# Patient Record
Sex: Male | Born: 1995
Health system: Southern US, Community
[De-identification: ages and names within clinical notes are randomized; demographics above are authoritative.]

## PROBLEM LIST (undated history)

## (undated) DIAGNOSIS — Z21 Asymptomatic human immunodeficiency virus [HIV] infection status: Secondary | ICD-10-CM

## (undated) DIAGNOSIS — J111 Influenza due to unidentified influenza virus with other respiratory manifestations: Secondary | ICD-10-CM

## (undated) DIAGNOSIS — B20 Human immunodeficiency virus [HIV] disease: Secondary | ICD-10-CM

## (undated) HISTORY — DX: Influenza due to unidentified influenza virus with other respiratory manifestations: J11.1

## (undated) HISTORY — DX: Asymptomatic human immunodeficiency virus (hiv) infection status: Z21

## (undated) HISTORY — DX: Human immunodeficiency virus (HIV) disease: B20

## (undated) HISTORY — PX: NO PAST SURGERIES: SHX2092

---
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
## 2015-11-20 ENCOUNTER — Ambulatory Visit (INDEPENDENT_AMBULATORY_CARE_PROVIDER_SITE_OTHER): Payer: BLUE CROSS/BLUE SHIELD | Admitting: Family

## 2015-11-20 ENCOUNTER — Encounter: Payer: Self-pay | Admitting: Family

## 2015-11-20 VITALS — BP 124/70 | HR 85 | Temp 97.7°F | Ht 68.75 in | Wt 152.0 lb

## 2015-11-20 DIAGNOSIS — A084 Viral intestinal infection, unspecified: Secondary | ICD-10-CM | POA: Diagnosis not present

## 2015-11-20 MED ORDER — ONDANSETRON 4 MG PO TBDP: 4.0000 mg | ORAL_TABLET | Freq: Three times a day (TID) | ORAL | Status: DC | PRN

## 2015-11-20 NOTE — Patient Instructions (Signed)

## 2015-11-20 NOTE — Progress Notes (Signed)
   Subjective:    Patient ID: Shawn Cooke, male    DOB: 1996-06-10, 20 y.o.   MRN: [id number]  Emesis  This is a new problem. The current episode started today. The problem occurs 5 to 10 times per day. The problem has been waxing and waning. The emesis has an appearance of stomach contents and bile. There has been no fever. Associated symptoms include abdominal pain and chills. Pertinent negatives include no dizziness, fever, myalgias or URI. He has tried bed rest for the symptoms. The treatment provided mild relief.      Review of Systems  Constitutional: Positive for chills. Negative for fever.  HENT: Negative.   Respiratory: Negative.   Cardiovascular: Negative.   Gastrointestinal: Positive for vomiting and abdominal pain.  Endocrine: Negative.   Genitourinary: Negative.   Musculoskeletal: Negative.  Negative for myalgias.  Neurological: Negative.  Negative for dizziness.  Hematological: Negative.   Psychiatric/Behavioral: Negative.   All other systems reviewed and are negative.      Objective:   Physical Exam  Constitutional: He is oriented to person, place, and time. He appears well-developed and well-nourished. No distress.  HENT:  Head: Normocephalic.  Right Ear: External ear normal.  Left Ear: External ear normal.  Nose: Nose normal.  Mouth/Throat: Oropharynx is clear and moist.  Eyes: Pupils are equal, round, and reactive to light. Right eye exhibits no discharge. Left eye exhibits no discharge.  Neck: Normal range of motion. Neck supple. No thyromegaly present.  Cardiovascular: Normal rate, regular rhythm, normal heart sounds and intact distal pulses.   No murmur heard. Pulmonary/Chest: Effort normal and breath sounds normal. No respiratory distress. He has no wheezes.  Abdominal: Soft. He exhibits no distension. There is no tenderness.  Hyperactive BS   Musculoskeletal: Normal range of motion. He exhibits no edema or tenderness.  Neurological: He is alert  and oriented to person, place, and time. He has normal reflexes. No cranial nerve deficit.  Skin: Skin is warm and dry. No rash noted. No erythema.  Psychiatric: He has a normal mood and affect. His behavior is normal. Judgment and thought content normal.  Vitals reviewed.     BP 124/70 mmHg  Pulse 85  Temp(Src) 97.7 F (36.5 C) (Oral)  Ht 5' 8.75" (1.746 m)  Wt 152 lb (68.947 kg)  BMI 22.62 kg/m2     Assessment & Plan:  1. Viral gastroenteritis -Force fluids -Bland diet -Tylenol prn for fever  -RTO prn  - ondansetron (ZOFRAN ODT) 4 MG disintegrating tablet; Take 1 tablet (4 mg total) by mouth every 8 (eight) hours as needed for nausea or vomiting.  Dispense: 20 tablet; Refill: 0  Jannifer Rodney, FNP

## 2015-12-11 ENCOUNTER — Ambulatory Visit (INDEPENDENT_AMBULATORY_CARE_PROVIDER_SITE_OTHER): Payer: BLUE CROSS/BLUE SHIELD | Admitting: Family Medicine

## 2015-12-11 ENCOUNTER — Ambulatory Visit (INDEPENDENT_AMBULATORY_CARE_PROVIDER_SITE_OTHER): Payer: BLUE CROSS/BLUE SHIELD

## 2015-12-11 ENCOUNTER — Encounter: Payer: Self-pay | Admitting: Family Medicine

## 2015-12-11 VITALS — BP 115/72 | HR 83 | Temp 98.1°F | Ht 68.0 in | Wt 159.2 lb

## 2015-12-11 DIAGNOSIS — M25572 Pain in left ankle and joints of left foot: Secondary | ICD-10-CM | POA: Diagnosis not present

## 2015-12-11 DIAGNOSIS — S93402A Sprain of unspecified ligament of left ankle, initial encounter: Secondary | ICD-10-CM | POA: Diagnosis not present

## 2015-12-11 MED ORDER — DICLOFENAC SODIUM 75 MG PO TBEC: 75.0000 mg | DELAYED_RELEASE_TABLET | Freq: Two times a day (BID) | ORAL | Status: DC

## 2015-12-11 NOTE — Patient Instructions (Signed)
Ankle Sprain  An ankle sprain is an injury to the strong, fibrous tissues (ligaments) that hold the bones of your ankle joint together.   CAUSES  An ankle sprain is usually caused by a fall or by twisting your ankle. Ankle sprains most commonly occur when you step on the outer edge of your foot, and your ankle turns inward. People who participate in sports are more prone to these types of injuries.   SYMPTOMS    Pain in your ankle. The pain may be present at rest or only when you are trying to stand or walk.   Swelling.   Bruising. Bruising may develop immediately or within 1 to 2 days after your injury.   Difficulty standing or walking, particularly when turning corners or changing directions.  DIAGNOSIS   Your caregiver will ask you details about your injury and perform a physical exam of your ankle to determine if you have an ankle sprain. During the physical exam, your caregiver will press on and apply pressure to specific areas of your foot and ankle. Your caregiver will try to move your ankle in certain ways. An X-ray exam may be done to be sure a bone was not broken or a ligament did not separate from one of the bones in your ankle (avulsion fracture).   TREATMENT   Certain types of braces can help stabilize your ankle. Your caregiver can make a recommendation for this. Your caregiver may recommend the use of medicine for pain. If your sprain is severe, your caregiver may refer you to a surgeon who helps to restore function to parts of your skeletal system (orthopedist) or a physical therapist.  HOME CARE INSTRUCTIONS    Apply ice to your injury for 1-2 days or as directed by your caregiver. Applying ice helps to reduce inflammation and pain.    Put ice in a plastic bag.    Place a towel between your skin and the bag.    Leave the ice on for 15-20 minutes at a time, every 2 hours while you are awake.   Only take over-the-counter or prescription medicines for pain, discomfort, or fever as directed by  your caregiver.   Elevate your injured ankle above the level of your heart as much as possible for 2-3 days.   If your caregiver recommends crutches, use them as instructed. Gradually put weight on the affected ankle. Continue to use crutches or a cane until you can walk without feeling pain in your ankle.   If you have a plaster splint, wear the splint as directed by your caregiver. Do not rest it on anything harder than a pillow for the first 24 hours. Do not put weight on it. Do not get it wet. You may take it off to take a shower or bath.   You may have been given an elastic bandage to wear around your ankle to provide support. If the elastic bandage is too tight (you have numbness or tingling in your foot or your foot becomes cold and blue), adjust the bandage to make it comfortable.   If you have an air splint, you may blow more air into it or let air out to make it more comfortable. You may take your splint off at night and before taking a shower or bath. Wiggle your toes in the splint several times per day to decrease swelling.  SEEK MEDICAL CARE IF:    You have rapidly increasing bruising or swelling.   Your toes feel   extremely cold or you lose feeling in your foot.   Your pain is not relieved with medicine.  SEEK IMMEDIATE MEDICAL CARE IF:   Your toes are numb or blue.   You have severe pain that is increasing.  MAKE SURE YOU:    Understand these instructions.   Will watch your condition.   Will get help right away if you are not doing well or get worse.     This information is not intended to replace advice given to you by your health care provider. Make sure you discuss any questions you have with your health care provider.     Document Released: 10/19/2005 Document Revised: 11/09/2014 Document Reviewed: 10/31/2011  Elsevier Interactive Patient Education 2016 Elsevier Inc[employer]

## 2015-12-11 NOTE — Progress Notes (Signed)
   Subjective:  Patient ID: Shawn Cooke, male    DOB: 02-Jul-1996  Age: 20 y.o. MRN: [id number]  CC: Ankle Pain   HPI Shawn Cooke presents for  Stepped wrong going for a layup playing basketball last night. Cmae down on side of foot. After game started having increasing pain in the left ankle. Awoke him during the night . 7/10 with swelling  History Shawn Cooke has no past medical history on file.   He has no past surgical history on file.   His family history is not on file.He reports that he has never smoked. He does not have any smokeless tobacco history on file. He reports that he does not drink alcohol or use illicit drugs.  No current outpatient prescriptions on file prior to visit.   No current facility-administered medications on file prior to visit.    ROS Review of Systems  Constitutional: Negative for fever, chills and diaphoresis.  HENT: Negative for rhinorrhea and sore throat.   Respiratory: Negative for cough and shortness of breath.   Cardiovascular: Negative for chest pain.  Gastrointestinal: Negative for abdominal pain.  Musculoskeletal: Positive for joint swelling and arthralgias. Negative for myalgias.  Skin: Negative for rash.  Neurological: Negative for weakness and headaches.    Objective:  BP 115/72 mmHg  Pulse 83  Temp(Src) 98.1 F (36.7 C) (Oral)  Ht  (1.727 m)  Wt 159 lb 3.2 oz (72.213 kg)  BMI 24.21 kg/m2  SpO2 98%  Physical Exam  Constitutional: He appears well-developed and well-nourished.  HENT:  Head: Normocephalic and atraumatic.  Right Ear: External ear normal.  Left Ear: External ear normal.  Mouth/Throat: No oropharyngeal exudate or posterior oropharyngeal erythema.  Eyes: Pupils are equal, round, and reactive to light.  Neck: Normal range of motion. Neck supple.  Cardiovascular: Normal rate and regular rhythm.   No murmur heard. Pulmonary/Chest: Breath sounds normal. No respiratory distress.  Abdominal: Bowel sounds  are normal.  Musculoskeletal: He exhibits edema (mild edema at left lateral malleolus and just superiorly.) and tenderness (for palpation lateral malleolua and all ROM L ankle. ).  Ligaments intact for opening at varus/valgus maneuvers and Drawer signs fo left ankle. NV intact BLE  Vitals reviewed.   Assessment & Plan:   REMIGIO was seen today for ankle pain.  Diagnoses and all orders for this visit:  Left ankle pain -     DG Ankle Complete Left  Sprain of left ankle, initial encounter -     DME Other see comment  Other orders -     diclofenac (VOLTAREN) 75 MG EC tablet; Take 1 tablet (75 mg total) by mouth 2 (two) times daily.   I have discontinued Mr. Greenough ondansetron. I am also having him start on diclofenac.  Meds ordered this encounter  Medications  . diclofenac (VOLTAREN) 75 MG EC tablet    Sig: Take 1 tablet (75 mg total) by mouth 2 (two) times daily.    Dispense:  60 tablet    Refill:  2     Follow-up: Return in about 2 weeks (around 12/25/2015), or if symptoms worsen or fail to improve.  Mechele Claude, M.D.

## 2016-06-03 ENCOUNTER — Ambulatory Visit (INDEPENDENT_AMBULATORY_CARE_PROVIDER_SITE_OTHER): Payer: BLUE CROSS/BLUE SHIELD | Admitting: Pediatrics

## 2016-06-03 ENCOUNTER — Encounter: Payer: Self-pay | Admitting: Pediatrics

## 2016-06-03 VITALS — BP 128/78 | HR 76 | Temp 97.1°F | Ht 68.05 in | Wt 160.6 lb

## 2016-06-03 DIAGNOSIS — R079 Chest pain, unspecified: Secondary | ICD-10-CM | POA: Diagnosis not present

## 2016-06-03 DIAGNOSIS — R0789 Other chest pain: Secondary | ICD-10-CM | POA: Diagnosis not present

## 2016-06-03 NOTE — Progress Notes (Signed)
    Subjective:    Patient ID: Shawn Cooke, male    DOB: 1996/01/29, 20 y.o.   MRN: [id number]  CC: Chest Pain   HPI: Shawn Cooke is a 20 y.o. 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 2/9 06/03/2016 12/11/2015  Decreased Interest 0 0  Down, Depressed, Hopeless 0 0  PHQ - 2 Score 0 0     Relevant past medical, surgical, family and social history reviewed and updated. Interim medical history since our last visit reviewed. Allergies and medications reviewed and updated.  History  Smoking Status  . Never Smoker  Smokeless Tobacco  . Never Used    ROS: Per HPI      Objective:    BP 128/78 (BP Location: Left Arm, Cuff Size: Normal)   Pulse 76   Temp 97.1 F (36.2 C) (Oral)   Ht 5' 8.05" (1.728 m)   Wt 160 lb 9.6 oz (72.8 kg)   BMI 24.38 kg/m   Wt Readings from Last 3 Encounters:  06/03/16 160 lb 9.6 oz (72.8 kg) (59 %, Z= 0.23)*  12/11/15 159 lb 3.2 oz (72.2 kg) (60 %, Z= 0.24)*  11/20/15 152 lb (68.9 kg) (49 %, Z= -0.04)*   * Growth percentiles are based on CDC 2-20 Years data.    Gen: NAD, alert, cooperative with exam, NCAT EYES: EOMI, no scleral injection or icterus ENT:  OP without erythema LYMPH: no cervical LAD CV: NRRR, normal S1/S2, no murmur sitting, lying down or with valsalva, distal pulses 2+ b/l Resp: CTABL, no wheezes, normal WOB Abd: +BS,  soft, NTND. no guarding or organomegaly Ext: No edema, warm Neuro: Alert and oriented, strength equal b/l UE and LE, coordination grossly normal MSK: some chest wall pain with palpation over L anterior ribs  EKG: nl rate, NSR, no inverted T waves, no ST changes, no delta waves    Assessment & Plan:    Shawn Cooke was seen today for chest pain.  Diagnoses and all orders for this visit:  Chest pain, unspecified chest pain type -     EKG 12-Lead  Atypical chest pain  Atypical chest pain, lasts all day when it comes or lasts for seconds, waxes and wanes, sometimes sharp, sometimes reproducible, not associated with exertion. Normal EKG. BP much improved at recheck. Possible pre-cordial catch syndrome. Try NSAIDs, stress relief, discussed return precautions.   Follow up plan: As needed  Rex Kras, MD [facility] 06/03/2016, 3:17 PM

## 2016-06-03 NOTE — Patient Instructions (Addendum)
Chest Wall Pain °Chest wall pain is pain in or around the bones and muscles of your chest. Sometimes, an injury causes this pain. Sometimes, the cause may not be known. This pain may take several weeks or longer to get better. °HOME CARE °Pay attention to any changes in your symptoms. Take these actions to help with your pain: °· Rest as told by your doctor. °· Avoid activities that cause pain. Try not to use your chest, belly (abdominal), or side muscles to lift heavy things. °· If directed, apply ice to the painful area: °¨ Put ice in a plastic bag. °¨ Place a towel between your skin and the bag. °¨ Leave the ice on for 20 minutes, 2-3 times per day. °· Take over-the-counter and prescription medicines only as told by your doctor. °· Do not use tobacco products, including cigarettes, chewing tobacco, and e-cigarettes. If you need help quitting, ask your doctor. °· Keep all follow-up visits as told by your doctor. This is important. °GET HELP IF: °· You have a fever. °· Your chest pain gets worse. °· You have new symptoms. °GET HELP RIGHT AWAY IF: °· You feel sick to your stomach (nauseous) or you throw up (vomit). °· You feel sweaty or light-headed. °· You have a cough with phlegm (sputum) or you cough up blood. °· You are short of breath. °  °This information is not intended to replace advice given to you by your health care provider. Make sure you discuss any questions you have with your health care provider. °  °Document Released: 04/06/2008 Document Revised: 07/10/2015 Document Reviewed: 01/14/2015 °Elsevier Interactive Patient Education ©2016 Elsevier Inc. ° °

## 2016-08-28 IMAGING — CR DG ANKLE COMPLETE 3+V*L*
3 series · 3 of 3 positions shown · non-contrast
Comparison: None.

CLINICAL DATA: Lateral ankle pain.  Basketball injury.

EXAM:
LEFT ANKLE COMPLETE - 3+ VIEW

[view not recorded (1 of 3)]
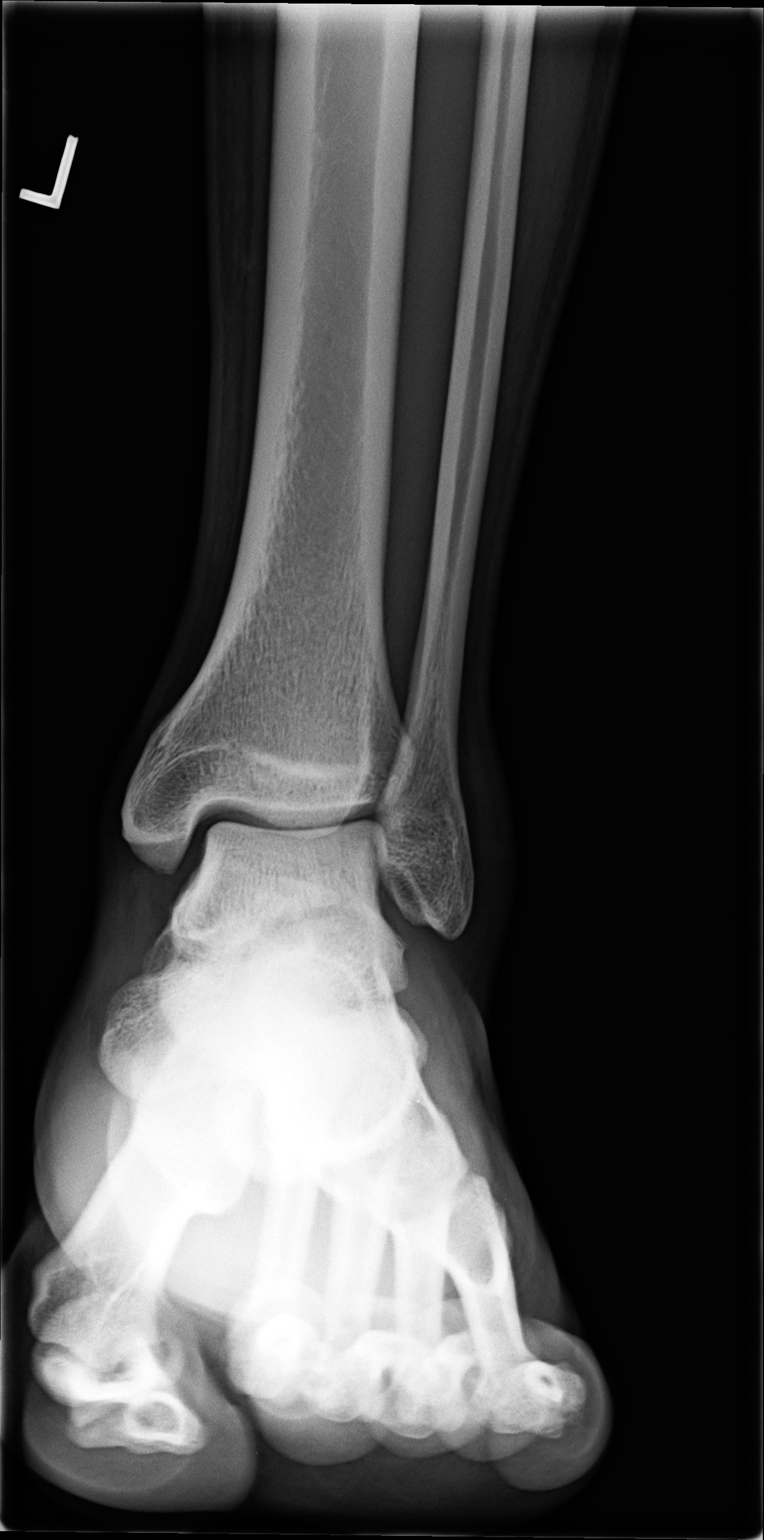

[view not recorded (2 of 3)]
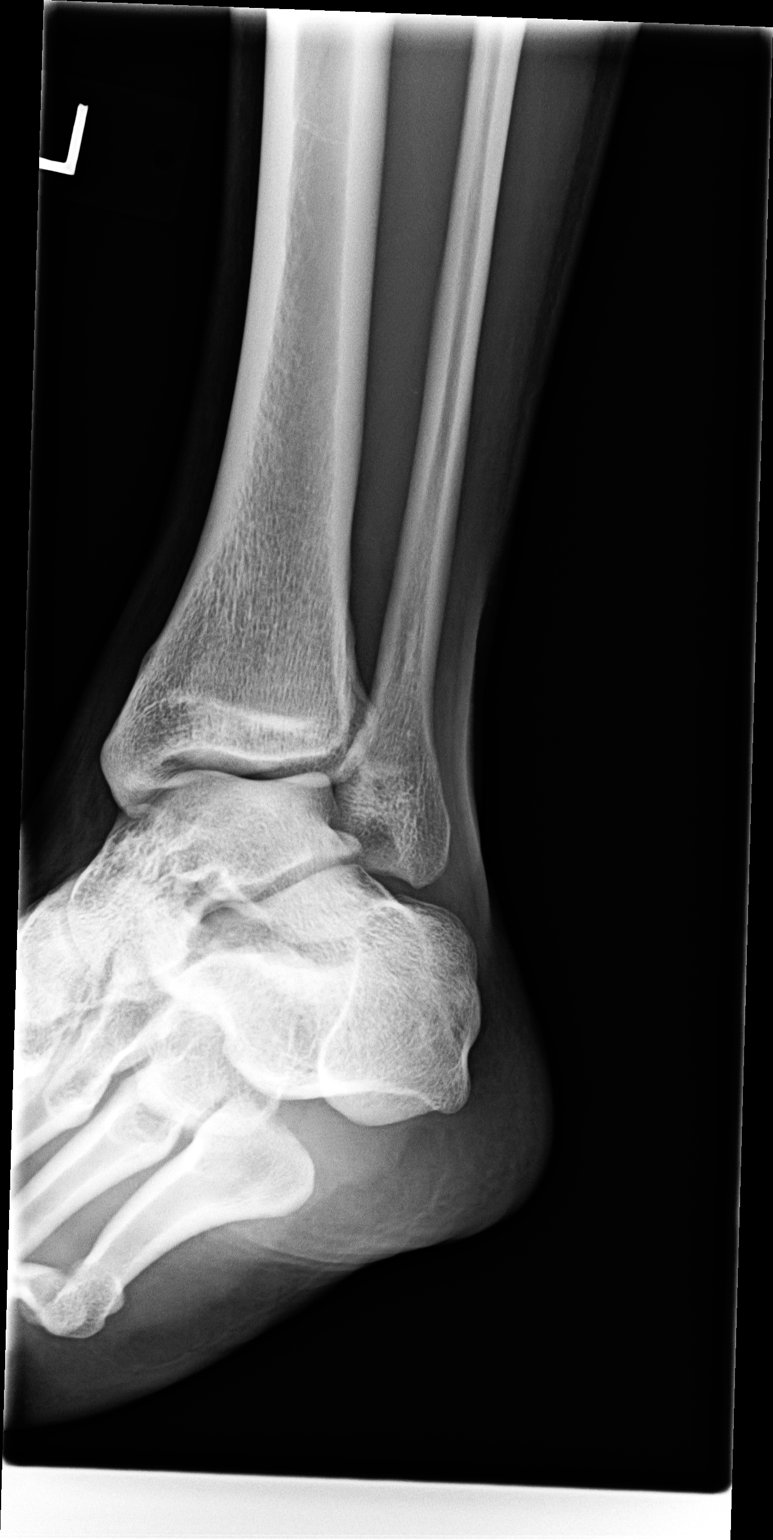

[view not recorded (3 of 3)]
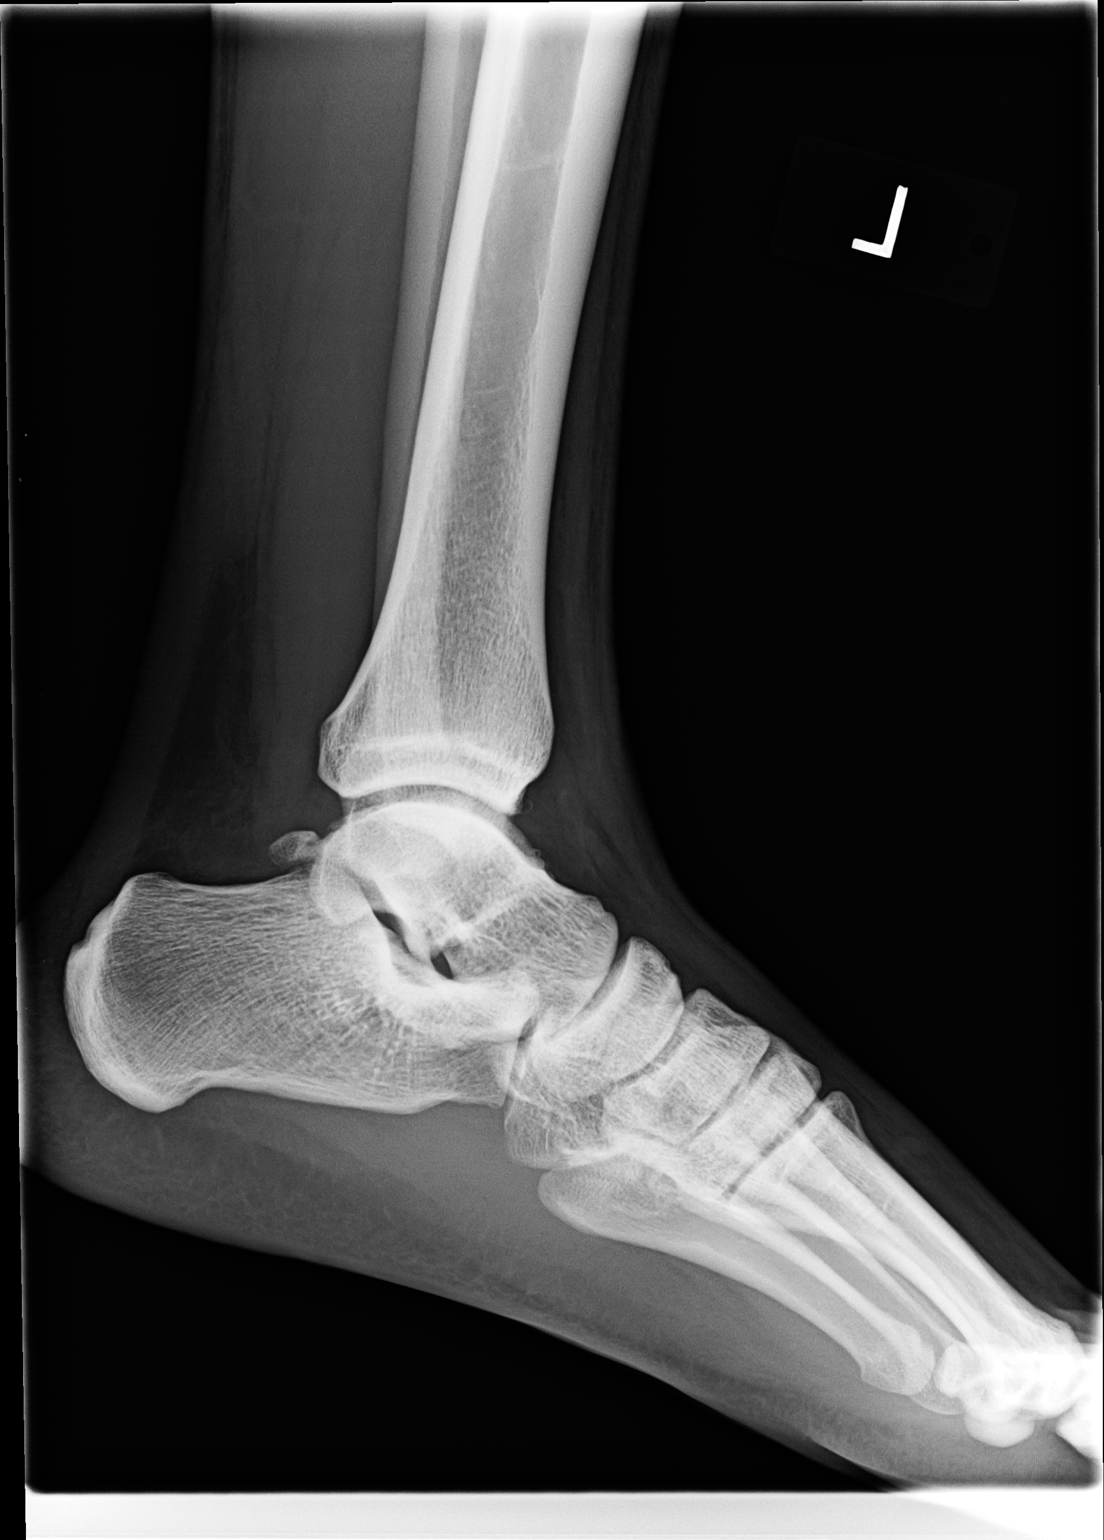

[3 of 3 positions shown; findings below may reference images not displayed]

FINDINGS: Mild soft tissue swelling overlying the lateral malleolus. No
fracture observed. Plafond and talar dome appear intact.

Os trigonum noted.
IMPRESSION: 1. Mild soft tissue swelling overlying the lateral malleolus without
underlying bony abnormality observed.

## 2016-10-01 DIAGNOSIS — S00511A Abrasion of lip, initial encounter: Secondary | ICD-10-CM | POA: Diagnosis not present

## 2016-10-01 DIAGNOSIS — H5712 Ocular pain, left eye: Secondary | ICD-10-CM | POA: Diagnosis not present

## 2016-10-01 DIAGNOSIS — G44309 Post-traumatic headache, unspecified, not intractable: Secondary | ICD-10-CM | POA: Diagnosis not present

## 2016-10-01 DIAGNOSIS — Y9289 Other specified places as the place of occurrence of the external cause: Secondary | ICD-10-CM | POA: Diagnosis not present

## 2016-11-05 DIAGNOSIS — F322 Major depressive disorder, single episode, severe without psychotic features: Secondary | ICD-10-CM | POA: Insufficient documentation

## 2016-11-06 DIAGNOSIS — F322 Major depressive disorder, single episode, severe without psychotic features: Secondary | ICD-10-CM | POA: Diagnosis not present

## 2017-01-23 DIAGNOSIS — R1084 Generalized abdominal pain: Secondary | ICD-10-CM | POA: Diagnosis not present

## 2017-01-23 DIAGNOSIS — E860 Dehydration: Secondary | ICD-10-CM | POA: Diagnosis not present

## 2017-01-23 DIAGNOSIS — R112 Nausea with vomiting, unspecified: Secondary | ICD-10-CM | POA: Diagnosis not present

## 2017-07-15 ENCOUNTER — Ambulatory Visit (INDEPENDENT_AMBULATORY_CARE_PROVIDER_SITE_OTHER): Payer: BLUE CROSS/BLUE SHIELD | Admitting: Family Medicine

## 2017-07-15 ENCOUNTER — Encounter: Payer: Self-pay | Admitting: Family Medicine

## 2017-07-15 ENCOUNTER — Telehealth: Payer: Self-pay | Admitting: Family Medicine

## 2017-07-15 ENCOUNTER — Ambulatory Visit (INDEPENDENT_AMBULATORY_CARE_PROVIDER_SITE_OTHER): Payer: BLUE CROSS/BLUE SHIELD

## 2017-07-15 VITALS — BP 143/78 | HR 74 | Temp 96.7°F | Ht 68.05 in | Wt 150.2 lb

## 2017-07-15 DIAGNOSIS — R6883 Chills (without fever): Secondary | ICD-10-CM | POA: Diagnosis not present

## 2017-07-15 DIAGNOSIS — R1084 Generalized abdominal pain: Secondary | ICD-10-CM

## 2017-07-15 DIAGNOSIS — R52 Pain, unspecified: Secondary | ICD-10-CM | POA: Diagnosis not present

## 2017-07-15 LAB — CBC WITH DIFFERENTIAL/PLATELET
BASOS ABS: 0 10*3/uL (ref 0.0–0.2)
Basos: 0 %
EOS (ABSOLUTE): 0.1 10*3/uL (ref 0.0–0.4)
Eos: 1 %
Hematocrit: 41.9 % (ref 37.5–51.0)
Hemoglobin: 14.3 g/dL (ref 13.0–17.7)
IMMATURE GRANS (ABS): 0 10*3/uL (ref 0.0–0.1)
IMMATURE GRANULOCYTES: 0 %
LYMPHS: 22 %
Lymphocytes Absolute: 2.8 10*3/uL (ref 0.7–3.1)
MCH: 29.1 pg (ref 26.6–33.0)
MCHC: 34.1 g/dL (ref 31.5–35.7)
MCV: 85 fL (ref 79–97)
Monocytes Absolute: 0.4 10*3/uL (ref 0.1–0.9)
Monocytes: 3 %
NEUTROS PCT: 74 %
Neutrophils Absolute: 9.3 10*3/uL — ABNORMAL HIGH (ref 1.4–7.0)
PLATELETS: 281 10*3/uL (ref 150–379)
RBC: 4.92 x10E6/uL (ref 4.14–5.80)
RDW: 13.2 % (ref 12.3–15.4)
WBC: 12.7 10*3/uL — AB (ref 3.4–10.8)

## 2017-07-15 LAB — CMP14+EGFR
ALK PHOS: 41 IU/L (ref 39–117)
ALT: 25 IU/L (ref 0–44)
AST: 25 IU/L (ref 0–40)
Albumin/Globulin Ratio: 1.4 (ref 1.2–2.2)
Albumin: 4.6 g/dL (ref 3.5–5.5)
BUN/Creatinine Ratio: 11 (ref 9–20)
BUN: 10 mg/dL (ref 6–20)
Bilirubin Total: 0.4 mg/dL (ref 0.0–1.2)
CO2: 22 mmol/L (ref 20–29)
CREATININE: 0.93 mg/dL (ref 0.76–1.27)
Calcium: 10.1 mg/dL (ref 8.7–10.2)
Chloride: 103 mmol/L (ref 96–106)
GFR calc Af Amer: 136 mL/min/{1.73_m2} (ref >59)
GFR calc non Af Amer: 118 mL/min/{1.73_m2} (ref >59)
GLOBULIN, TOTAL: 3.2 g/dL (ref 1.5–4.5)
GLUCOSE: 140 mg/dL — AB (ref 65–99)
Potassium: 3.7 mmol/L (ref 3.5–5.2)
Sodium: 144 mmol/L (ref 134–144)
Total Protein: 7.8 g/dL (ref 6.0–8.5)

## 2017-07-15 LAB — VERITOR FLU A/B WAIVED
INFLUENZA A: NEGATIVE
INFLUENZA B: NEGATIVE

## 2017-07-15 LAB — LIPASE: Lipase: 22 U/L (ref 13–78)

## 2017-07-15 LAB — ETHANOL: ETHANOL LVL: NEGATIVE %

## 2017-07-15 MED ORDER — PROMETHAZINE HCL 25 MG/ML IJ SOLN
25.0000 mg | Freq: Once | INTRAMUSCULAR | Status: AC
  Administered 2017-07-15: 25 mg via INTRAMUSCULAR

## 2017-07-15 MED ORDER — ONDANSETRON 4 MG PO TBDP: 4.0000 mg | ORAL_TABLET | Freq: Three times a day (TID) | ORAL | 0 refills | Status: DC | PRN

## 2017-07-15 NOTE — Telephone Encounter (Signed)
calleda nd discussed with his mother, Idell PicklesDella. Labs reviewed  Mild WBC elevation, otherwise reassuring.   Mother states he is doing much better. Eating PO.   Murtis SinkSam Micayla Brathwaite, MD [facility] 07/15/2017, 7:09 PM

## 2017-07-15 NOTE — Addendum Note (Signed)
Addended by: Angela AdamOSTOSKY, Fadumo Heng C on: 07/15/2017 09:39 AM   Modules accepted: Orders

## 2017-07-15 NOTE — Progress Notes (Signed)
   HPI  Patient presents today here with acute illness.  Patient explains that he woke up this morning with severe emesis, body aches, and chills.  He denies cough, shortness of breath, or chest pain.  He has generalized abdominal pain.  His mother states that he's been ill once before in this manner when he drank a lot of alcohol. He denies drinking alcohol today.  He has thrown up several times this morning, nonbloody and nonbilious. He has not been tolerating fluids.   PMH: Smoking status noted ROS: Per HPI  Objective: BP (!) 143/78   Pulse 74   Temp (!) 96.7 F (35.9 C) (Oral)   Ht 5' 8.05" (1.728 m)   Wt 150 lb 3.2 oz (68.1 kg)   BMI 22.80 kg/m  Gen: NAD, alert, cooperative with exam HEENT: NCAT, oropharynx clear, nares clear, oropharynx moist CV: RRR, good S1/S2, no murmur Resp: CTABL, no wheezes, non-labored Abd: Soft, mild tenderness throughout and reportedly the worst in right upper quadrant and epigastric area first Ext: No edema, warm Neuro: Alert and oriented, No gross deficits  Assessment and plan:  # Body aches, generalized abdominal pain Patient with severe nausea vomiting, body aches, and abdominal pain today. Abdominal exam is benign, he does have tenderness throughout, however no guarding and he has good bowel sounds. We have given him IM Phenergan to help him tolerate fluids, also given Zofran ODT. Discussed likely viral etiology with mother, however will rule out other etiology with stat labs. Plain film. If worsening may need to go to the emergency room for IV fluids and further workup     Orders Placed This Encounter  Procedures  . DG Abd 2 Views    Standing Status:   Future    Standing Expiration Date:   09/14/2018    Order Specific Question:   Reason for Exam (SYMPTOM  OR DIAGNOSIS REQUIRED)    Answer:   abd pain    Order Specific Question:   Preferred imaging location?    Answer:   Internal    Order Specific Question:   Radiology  Contrast Protocol - do NOT remove file path    Answer:   [email or web address]  . Veritor Flu A/B Waived    Order Specific Question:   Source    Answer:   nasal  . CMP14+EGFR  . Ethanol  . Lipase  . CBC with Differential/Platelet    Meds ordered this encounter  Medications  . ondansetron (ZOFRAN ODT) 4 MG disintegrating tablet    Sig: Take 1 tablet (4 mg total) by mouth every 8 (eight) hours as needed for nausea or vomiting.    Dispense:  20 tablet    Refill:  0    Laroy Apple, MD [facility] 07/15/2017, 8:51 AM

## 2017-07-15 NOTE — Patient Instructions (Signed)
Great to meet you! I believe this is a voiral illness but we will do our best to rule out more serious things.   For now get rest, tylenol, and fluids.   Try zofran for nausea   Nausea and Vomiting, Adult Feeling sick to your stomach (nausea) means that your stomach is upset or you feel like you have to throw up (vomit). Feeling more and more sick to your stomach can lead to throwing up. Throwing up happens when food and liquid from your stomach are thrown up and out the mouth. Throwing up can make you feel weak and cause you to get dehydrated. Dehydration can make you tired and thirsty, make you have a dry mouth, and make it so you pee (urinate) less often. Older adults and people with other diseases or a weak defense system (immune system) are at higher risk for dehydration. If you feel sick to your stomach or if you throw up, it is important to follow instructions from your doctor about how to take care of yourself. Follow these instructions at home: Eating and drinking Follow these instructions as told by your doctor:  Take an oral rehydration solution (ORS). This is a drink that is sold at pharmacies and stores.  Drink clear fluids in small amounts as you are able, such as: ? Water. ? Ice chips. ? Diluted fruit juice. ? Low-calorie sports drinks.  Eat bland, easy-to-digest foods in small amounts as you are able, such as: ? Bananas. ? Applesauce. ? Rice. ? Low-fat (lean) meats. ? Toast. ? Crackers.  Avoid fluids that have a lot of sugar or caffeine in them.  Avoid alcohol.  Avoid spicy or fatty foods.  General instructions  Drink enough fluid to keep your pee (urine) clear or pale yellow.  Wash your hands often. If you cannot use soap and water, use hand sanitizer.  Make sure that all people in your home wash their hands well and often.  Take over-the-counter and prescription medicines only as told by your doctor.  Rest at home while you get better.  Watch your  condition for any changes.  Breathe slowly and deeply when you feel sick to your stomach.  Keep all follow-up visits as told by your doctor. This is important. Contact a doctor if:  You have a fever.  You cannot keep fluids down.  Your symptoms get worse.  You have new symptoms.  You feel sick to your stomach for more than two days.  You feel light-headed or dizzy.  You have a headache.  You have muscle cramps. Get help right away if:  You have pain in your chest, neck, arm, or jaw.  You feel very weak or you pass out (faint).  You throw up again and again.  You see blood in your throw-up.  Your throw-up looks like black coffee grounds.  You have bloody or black poop (stools) or poop that look like tar.  You have a very bad headache, a stiff neck, or both.  You have a rash.  You have very bad pain, cramping, or bloating in your belly (abdomen).  You have trouble breathing.  You are breathing very quickly.  Your heart is beating very quickly.  Your skin feels cold and clammy.  You feel confused.  You have pain when you pee.  You have signs of dehydration, such as: ? Dark pee, hardly any pee, or no pee. ? Cracked lips. ? Dry mouth. ? Sunken eyes. ? Sleepiness. ? Weakness. These  symptoms may be an emergency. Do not wait to see if the symptoms will go away. Get medical help right away. Call your local emergency services (911 in the [place] Do not drive yourself to the hospital. This information is not intended to replace advice given to you by your health care provider. Make sure you discuss any questions you have with your health care provider. Document Released: 04/06/2008 Document Revised: 05/08/2016 Document Reviewed: 06/25/2015 Elsevier Interactive Patient Education  2018 [employer]

## 2017-07-16 ENCOUNTER — Encounter: Payer: Self-pay | Admitting: Family Medicine

## 2017-07-16 ENCOUNTER — Telehealth: Payer: Self-pay | Admitting: Family Medicine

## 2017-07-16 ENCOUNTER — Ambulatory Visit (INDEPENDENT_AMBULATORY_CARE_PROVIDER_SITE_OTHER): Payer: BLUE CROSS/BLUE SHIELD

## 2017-07-16 ENCOUNTER — Ambulatory Visit (INDEPENDENT_AMBULATORY_CARE_PROVIDER_SITE_OTHER): Payer: BLUE CROSS/BLUE SHIELD | Admitting: Family Medicine

## 2017-07-16 VITALS — BP 142/74 | HR 99 | Temp 99.8°F | Ht 68.0 in | Wt 152.0 lb

## 2017-07-16 DIAGNOSIS — R0602 Shortness of breath: Secondary | ICD-10-CM

## 2017-07-16 DIAGNOSIS — R0781 Pleurodynia: Secondary | ICD-10-CM

## 2017-07-16 MED ORDER — AZITHROMYCIN 250 MG PO TABS
ORAL_TABLET | ORAL | 0 refills | Status: DC
Start: 2017-07-16 — End: 2017-07-27

## 2017-07-16 MED ORDER — NAPROXEN 500 MG PO TABS: 500.0000 mg | ORAL_TABLET | Freq: Two times a day (BID) | ORAL | 0 refills | Status: DC

## 2017-07-16 NOTE — Telephone Encounter (Signed)
Pt is having some R breast pain with SOB Unable to take a deep breath Pain is constant Please advise

## 2017-07-16 NOTE — Telephone Encounter (Signed)
appt scheduled Pt notified 

## 2017-07-16 NOTE — Patient Instructions (Addendum)
You had a chest x-ray performed today; this did not show evidence of pneumonia. The likelihood of heart attack or blood clot in the lung is extremely low. I suspect that her symptoms may be related to your recent GI illness.  You may be having rib inflammation from recent vomiting episodes. I have prescribed you naproxen 500 mg. He can take this by mouth twice a day with a meal as needed for rib pain.  If you find that your symptoms worsen, you develop swelling in her legs, you feel like your heart is racing, he developed worsening fever or able to stay hydrated, please seek immediate medical attention. Otherwise, I expect that your symptoms will improve over the next couple of days. You may go about her normal daily activities as tolerated.   Costochondritis Costochondritis is swelling and irritation (inflammation) of the tissue (cartilage) that connects your ribs to your breastbone (sternum). This causes pain in the front of your chest. The pain usually starts gradually and involves more than one rib. What are the causes? The exact cause of this condition is not always known. It results from stress on the cartilage where your ribs attach to your sternum. The cause of this stress could be:  Chest injury (trauma).  Exercise or activity, such as lifting.  Severe coughing.  What increases the risk? You may be at higher risk for this condition if you:  Are male.  Are 26?21 years old.  Recently started a new exercise or work activity.  Have low levels of vitamin D.  Have a condition that makes you cough frequently.  What are the signs or symptoms? The main symptom of this condition is chest pain. The pain:  Usually starts gradually and can be sharp or dull.  Gets worse with deep breathing, coughing, or exercise.  Gets better with rest.  May be worse when you press on the sternum-rib connection (tenderness).  How is this diagnosed? This condition is diagnosed based on your  symptoms, medical history, and a physical exam. Your health care provider will check for tenderness when pressing on your sternum. This is the most important finding. You may also have tests to rule out other causes of chest pain. These may include:  A chest X-ray to check for lung problems.  An electrocardiogram (ECG) to see if you have a heart problem that could be causing the pain.  An imaging scan to rule out a chest or rib fracture.  How is this treated? This condition usually goes away on its own over time. Your health care provider may prescribe an NSAID to reduce pain and inflammation. Your health care provider may also suggest that you:  Rest and avoid activities that make pain worse.  Apply heat or cold to the area to reduce pain and inflammation.  Do exercises to stretch your chest muscles.  If these treatments do not help, your health care provider may inject a numbing medicine at the sternum-rib connection to help relieve the pain. Follow these instructions at home:  Avoid activities that make pain worse. This includes any activities that use chest, abdominal, and side muscles.  If directed, put ice on the painful area: ? Put ice in a plastic bag. ? Place a towel between your skin and the bag. ? Leave the ice on for 20 minutes, 2-3 times a day.  If directed, apply heat to the affected area as often as told by your health care provider. Use the heat source that your health  care provider recommends, such as a moist heat pack or a heating pad. ? Place a towel between your skin and the heat source. ? Leave the heat on for 20-30 minutes. ? Remove the heat if your skin turns bright red. This is especially important if you are unable to feel pain, heat, or cold. You may have a greater risk of getting burned.  Take over-the-counter and prescription medicines only as told by your health care provider.  Return to your normal activities as told by your health care provider. Ask  your health care provider what activities are safe for you.  Keep all follow-up visits as told by your health care provider. This is important. Contact a health care provider if:  You have chills or a fever.  Your pain does not go away or it gets worse.  You have a cough that does not go away (is persistent). Get help right away if:  You have shortness of breath. This information is not intended to replace advice given to you by your health care provider. Make sure you discuss any questions you have with your health care provider. Document Released: 07/29/2005 Document Revised: 05/08/2016 Document Reviewed: 02/12/2016 Elsevier Interactive Patient Education  [employer].

## 2017-07-16 NOTE — Telephone Encounter (Signed)
Would recommend being seen and evaluated.   I have placed a CXR as well. We should consider pneumonia as a distinct possibility.   Murtis Sink, MD [facility] 07/16/2017, 11:50 AM

## 2017-07-16 NOTE — Progress Notes (Addendum)
Subjective: ZO:XWRUE sided chest pain, SOB PCP: Mechele Claude, MD AVW:[id number] Shawn Cooke is a 21 y.o. male presenting to clinic today for:  1. Chest pain/ SOB Patient was seen in clinic yesterday for acute abdominal pain. He had an abdominal x-ray was unremarkable. Blood labs revealed a leukocytosis to 12.7 with elevation in neutrophils. Lipase was negative, liver function tests were normal and flu panel was negative.  Today he reports that last evening he felt difficulty taking a deep breath in. He notes that this was most notable when he laid down. He reports associated slight pressure in the right chest. This does not radiate anywhere. No actual chest pain, including pleuritic chest pain. No cough, wheeze, palpitations, lower extremity edema, calf swelling, or pain with walking. He reports feeling warm at home last night. No actual measured fevers. He reports his niece was sick with a cold recently. Otherwise no known sick contacts. Denies any unplanned weight loss, history of asthma, or reflux symptoms. He reports his abdominal pain and nausea and vomiting have improved. He is tolerating exercise normally, citing that he was able to play basketball without difficulty this morning. Though he notes he did have to rest afterwards. He is use Tylenol for symptoms without improvement.  Patient suspects that he may have a distant family history of heart disease; though, he is unable to tell me what type of heart disease his great grandfather had. No known history of early cardiac death. Both of his parents are healthy.  No Known Allergies History reviewed. No pertinent past medical history. Family History  Problem Relation Age of Onset  . Family history unknown: Yes   Social Hx: former smoker.Current medications reviewed.   ROS: Per HPI  Objective: Office vital signs reviewed. BP (!) 142/74   Pulse 99   Temp 99.8 F (37.7 C) (Oral)   Ht  (1.727 m)   Wt 152 lb (68.9 kg)   SpO2 98%    BMI 23.11 kg/m   Physical Examination:  General: Awake, alert, well nourished, nontoxic appearing, No acute distress HEENT: Normal    Neck: No masses palpated. No lymphadenopathy    Eyes: Extraocular membranes intact, sclera white    Throat: moist mucus membranes, no erythema, no tonsillar exudate.  Airway is patent Cardio: regular rate and rhythm, S1S2 heard, no murmurs appreciated Pulm: clear to auscultation bilaterally, no wheezes, rhonchi or rales; normal work of breathing on room air Extremities: warm, well perfused, No edema, cyanosis or clubbing; +2 pulses bilaterally MSK: normal gait and normal station   No results found.   Assessment/ Plan: 21 y.o. male   1. Shortness of breath Unremarkable cardiopulmonary exam. He has a low-grade fever to 99.46F today in office. He had a normal respiratory rate and normal pulse. I reviewed yesterday's blood labs and abdominal x-ray. His CBC was notable for a mild leukocytosis to 12.7 with associated slight elevation in neutrophils. All other labs and imaging studies were unremarkable. I discussed with him that sometimes you can see an elevation in white blood cell count with a virus but that given his sensation of shortness of breath, a chest x-ray is appropriate to rule out pneumonia. Personal review of chest x-ray today demonstrated findings suggestive of viral upper respiratory infection.  However, an atypical pneumonia could also present this way. He does sit the age group for walking pneumonia. For this reason, I prescribed him a Z-Pak for empiric treatment. Likelihood of cardiac ischemia is extremely low in this healthy, young male.  He has no red flag signs on exam, and he has no significant family history of cardiovascular disease. Wells' score for PE 0, doubt pulmonary embolism. Acid reflux was considered, especially given his recent visit for abdominal pain.   - DG Chest 2 View  2. Rib pain on right side Likely related to recent vomiting  from GI illness. It appears now he has an upper respiratory infection on chest x-ray. I prescribed him an NSAID to use as needed with a meal up to twice daily. I reviewed use of medication with patient. He voiced good understanding. Return precautions reviewed. He'll follow-up as needed. - naproxen (NAPROSYN) 500 MG tablet; Take 1 tablet (500 mg total) by mouth 2 (two) times daily with a meal. (as needed for rib pain)  Dispense: 14 tablet; Refill: 0   Meds ordered this encounter  Medications  . naproxen (NAPROSYN) 500 MG tablet    Sig: Take 1 tablet (500 mg total) by mouth 2 (two) times daily with a meal. (as needed for rib pain)    Dispense:  14 tablet    Refill:  0  . azithromycin (ZITHROMAX Z-PAK) 250 MG tablet    Sig: As directed    Dispense:  6 tablet    Refill:  0     Ashly Hulen Skains, DO [facility] [phone]

## 2017-07-16 NOTE — Addendum Note (Signed)
Addended by: Raliegh Ip on: 07/16/2017 02:31 PM   Modules accepted: Orders

## 2017-07-27 ENCOUNTER — Encounter: Payer: Self-pay | Admitting: Family Medicine

## 2017-07-27 ENCOUNTER — Ambulatory Visit (INDEPENDENT_AMBULATORY_CARE_PROVIDER_SITE_OTHER): Payer: BLUE CROSS/BLUE SHIELD | Admitting: Family Medicine

## 2017-07-27 VITALS — BP 131/86 | HR 98 | Temp 99.3°F | Ht 68.0 in | Wt 150.0 lb

## 2017-07-27 DIAGNOSIS — R0602 Shortness of breath: Secondary | ICD-10-CM

## 2017-07-27 MED ORDER — ALBUTEROL SULFATE (2.5 MG/3ML) 0.083% IN NEBU
2.5000 mg | INHALATION_SOLUTION | Freq: Once | RESPIRATORY_TRACT | Status: AC
  Administered 2017-07-27: 2.5 mg via RESPIRATORY_TRACT

## 2017-07-27 MED ORDER — PREDNISONE 20 MG PO TABS: 40.0000 mg | ORAL_TABLET | Freq: Every day | ORAL | 0 refills | Status: DC

## 2017-07-27 NOTE — Progress Notes (Signed)
Subjective: CC: abnormal breathing PCP: Mechele Claude, MD JXB:[id number] Valvano is a 21 y.o. male presenting to clinic today for:  1. Abnormal breathing Patient reports that he has persistent sensation of not being able to get a deep breath in. He describes chest tightness that is intermittent. He does feel that symptoms seem to be more prominent as the day goes on, noting that he is essentially asymptomatic each morning. He denies measured fevers at home. He does report that he will monitor his pulse at home and that sometimes he feels that it is irregular. He denies overt chest pain, shortness of breath, wheeze, nausea vomiting, lower extremity swelling, recent travel or immobility for greater than 3 hours. No hemoptysis, cough. He has been taking naproxen as well with no improvement in symptoms. 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 He denies having had asthma or pulmonary issues since birth.  He denies anxiety.  No Known Allergies No past medical history on file. Family History  Problem Relation Age of Onset  . Family history unknown: Yes   Social Hx: non smoker.Current medications reviewed.   ROS: Per HPI  Objective: Office vital signs reviewed. BP 131/86   Pulse 98   Temp 99.3 F (37.4 C) (Oral)   Ht  (1.727 m)   Wt 150 lb (68 kg)   BMI 22.81 kg/m   Physical Examination:  General: Awake, alert, well nourished, well appearing, nontoxic, No acute distress HEENT: Normal    Neck: No masses palpated. No lymphadenopathy    Eyes: extraocular membranes intact, sclera white    Throat: moist mucus membranes, no erythema.  Airway is patent Cardio: regular rate and rhythm, S1S2 heard, no murmurs appreciated Pulm: clear to auscultation bilaterally, no wheezes, rhonchi or rales; normal work of breathing on room air Psych: Mood stable, speech normal, fair eye contact, appropriate affect  Depression screen  [facility] 2/9 07/27/2017 07/15/2017 06/03/2016  Decreased Interest 0 0 0  Down, Depressed, Hopeless 0 0 0  PHQ - 2 Score 0 0 0    Assessment/ Plan: 21 y.o. male   1. Shortness of breath Patient actually has normal work of breathing with normal respiratory rate and normal oxygen saturation on room air. His heart rate and blood pressure are also within normal limits. He does have a low-grade fever here in office to 99.55F. He is status post treatment with Z-Pak for possible atypical pneumonia. However, chest x-ray did not reveal acute pulmonary infiltrates. Despite normal cardiopulmonary exam, a trial of nebulized albuterol was administered during office visit today. Patient felt no different after administration of albuterol. We did discuss consideration for prednisone although, this would be outside the goal standard of treatment considering he has no known pulmonary disease. He wishes to proceed with trial of prednisone to see if this will improve his breathing. We did discuss the risks and potential side effects of the medication. He voiced good understanding. PE was considered. Patient's well's score is 0. However, given the persistent nature of him feeling short of breath, a d-dimer was added to his repeat CBC today. If blood labs are unremarkable and patient does not respond to prednisone, would consider evaluating for anxiety versus referral to pulmonologist for further evaluation and management. Return precautions were reviewed with the patient. Patient to follow-up as needed with primary care doctor. - CBC - D-dimer, quantitative (not at [facility]) - predniSONE (DELTASONE) 20 MG tablet; Take 2  tablets (40 mg total) by mouth daily with breakfast.  Dispense: 10 tablet; Refill: 0 - albuterol (PROVENTIL) (2.5 MG/3ML) 0.083% nebulizer solution 2.5 mg; Take 3 mLs (2.5 mg total) by nebulization once.   Orders Placed This Encounter  Procedures  . CBC  . D-dimer, quantitative (not at [facility])   Meds ordered this  encounter  Medications  . predniSONE (DELTASONE) 20 MG tablet    Sig: Take 2 tablets (40 mg total) by mouth daily with breakfast.    Dispense:  10 tablet    Refill:  0  . albuterol (PROVENTIL) (2.5 MG/3ML) 0.083% nebulizer solution 2.5 mg     Raliegh Ip, DO [facility] [phone]

## 2017-07-27 NOTE — Patient Instructions (Signed)
As we discussed, your x-ray was more suggestive of a viral process and did not reveal a true pneumonia. However, you were treated for possible atypical pneumonia with azithromycin. Your symptoms are somewhat unusual, as your cardio pulmonary exam is normal. Sometimes, oral steroids can help open up the airways after an acute upper respiratory illness. I have sent in a short course of oral prednisone. You may take the first dose today. However, you may notice difficulty with sleep as a result of the medication. Other common side effects including increased appetite. You also given an albuterol inhaler treatment to see if this would help you feel like you can breathe better. I've also ordered a repeat CBC to evaluate for possible persistent evidence of infection and something called a d-dimer to rule out possible clot in your lung. Again, as we discussed I have a very low suspicion that the d-dimer will be positive. You'll be contacted with results of these labs.   Shortness of Breath, Adult Shortness of breath means you have trouble breathing. Your lungs are organs for breathing. Follow these instructions at home: Pay attention to any changes in your symptoms. Take these actions to help with your condition:  Do not smoke. Smoking can cause shortness of breath. If you need help to quit smoking, ask your doctor.  Avoid things that can make it harder to breathe, such as: ? Mold. ? Dust. ? Air pollution. ? Chemical smells. ? Things that can cause allergy symptoms (allergens), if you have allergies.  Keep your living space clean and free of mold and dust.  Rest as needed. Slowly return to your usual activities.  Take over-the-counter and prescription medicines, including oxygen and inhaled medicines, only as told by your doctor.  Keep all follow-up visits as told by your doctor. This is important.  Contact a doctor if:  Your condition does not get better as soon as expected.  You have a hard  time doing your normal activities, even after you rest.  You have new symptoms. Get help right away if:  You have trouble breathing when you are resting.  You feel light-headed or you faint.  You have a cough that is not helped by medicines.  You cough up blood.  You have pain with breathing.  You have pain in your chest, arms, shoulders, or belly (abdomen).  You have a fever.  You cannot walk up stairs.  You cannot exercise the way you normally do. This information is not intended to replace advice given to you by your health care provider. Make sure you discuss any questions you have with your health care provider. Document Released: 04/06/2008 Document Revised: 11/05/2016 Document Reviewed: 11/05/2016 Elsevier Interactive Patient Education  2017 [employer].

## 2017-07-28 ENCOUNTER — Telehealth: Payer: Self-pay | Admitting: Family Medicine

## 2017-07-28 DIAGNOSIS — R791 Abnormal coagulation profile: Secondary | ICD-10-CM | POA: Diagnosis not present

## 2017-07-28 DIAGNOSIS — R0789 Other chest pain: Secondary | ICD-10-CM | POA: Diagnosis not present

## 2017-07-28 DIAGNOSIS — Z7952 Long term (current) use of systemic steroids: Secondary | ICD-10-CM | POA: Diagnosis not present

## 2017-07-28 DIAGNOSIS — F17200 Nicotine dependence, unspecified, uncomplicated: Secondary | ICD-10-CM | POA: Diagnosis not present

## 2017-07-28 DIAGNOSIS — M940 Chondrocostal junction syndrome [Tietze]: Secondary | ICD-10-CM | POA: Diagnosis not present

## 2017-07-28 LAB — CBC
HEMOGLOBIN: 13.7 g/dL (ref 13.0–17.7)
Hematocrit: 40.8 % (ref 37.5–51.0)
MCH: 29 pg (ref 26.6–33.0)
MCHC: 33.6 g/dL (ref 31.5–35.7)
MCV: 86 fL (ref 79–97)
PLATELETS: 276 10*3/uL (ref 150–379)
RBC: 4.72 x10E6/uL (ref 4.14–5.80)
RDW: 13.2 % (ref 12.3–15.4)
WBC: 6.3 10*3/uL (ref 3.4–10.8)

## 2017-07-28 LAB — D-DIMER, QUANTITATIVE (NOT AT ARMC): D-DIMER: 0.53 mg{FEU}/L — AB (ref 0.00–0.49)

## 2017-07-28 NOTE — Telephone Encounter (Signed)
Call to discuss the positive/elevated d-dimer. At this time, cannot rule out pulmonary embolism. I did discuss with patient that it is imperative that he goes to the emergency department for CAT scan of the chest to further evaluate. Patient voices good understanding and will proceed to the emergency department now. At this time, he reports that his symptoms are stable. No chest pain, tachycardia, dyspnea. No known risk factors for pulmonary embolism. If pulmonary embolism is confirmed, would consider hypercoagulability panel prior to initiation of anticoagulants.  Results for orders placed or performed in visit on 07/27/17 (from the past 24 hour(s))  CBC     Status: None   Collection Time: 07/27/17  4:02 PM  Result Value Ref Range   WBC 6.3 3.4 - 10.8 x10E3/uL   RBC 4.72 4.14 - 5.80 x10E6/uL   Hemoglobin 13.7 13.0 - 17.7 g/dL   Hematocrit 16.1 09.6 - 51.0 %   MCV 86 79 - 97 fL   MCH 29.0 26.6 - 33.0 pg   MCHC 33.6 31.5 - 35.7 g/dL   RDW 04.5 40.9 - 81.1 %   Platelets 276 150 - 379 x10E3/uL   Narrative   Performed at:  [address] [place], [place]  [id number] Lab Director: Mila Homer MD, Phone:  [phone]  D-dimer, quantitative (not at [facility])     Status: Abnormal   Collection Time: 07/27/17  4:02 PM  Result Value Ref Range   D-DIMER 0.53 (H) 0.00 - 0.49 mg/L FEU   Narrative   Performed at:  [address] [place] [address] [place], [place]  [id number] Lab Director: Mila Homer MD, Phone:  [phone]   Rozell Searing. Nadine Counts, DO [facility]

## 2017-07-28 NOTE — Telephone Encounter (Signed)
Patient calls to update me regarding his visit to the emergency department. He reports that his labs were actually repeated in the emergency department and his d-dimer had decreased to 0.29, negative. For this reason, CT chest was not pursued. I agree with not pursuing imaging exposing patient to radiation in the setting of a now negative d-dimer. We discussed the various reasons why the d-dimer may have been elevated, including inflammation and infection. He reports that he is actually feeling much better now with the prednisone. He has no concerns or questions at this time. He will follow up as needed.  Ashly M. Nadine Counts, DO [facility]

## 2017-08-05 ENCOUNTER — Telehealth: Payer: Self-pay | Admitting: Family Medicine

## 2017-08-05 NOTE — Telephone Encounter (Signed)
appt scheduled for recheck

## 2017-08-06 ENCOUNTER — Ambulatory Visit: Payer: BLUE CROSS/BLUE SHIELD | Admitting: Family Medicine

## 2017-08-10 ENCOUNTER — Encounter: Payer: Self-pay | Admitting: Family Medicine

## 2017-08-10 DIAGNOSIS — R791 Abnormal coagulation profile: Secondary | ICD-10-CM | POA: Diagnosis not present

## 2017-08-10 DIAGNOSIS — R0789 Other chest pain: Secondary | ICD-10-CM | POA: Diagnosis not present

## 2017-08-10 DIAGNOSIS — Z87891 Personal history of nicotine dependence: Secondary | ICD-10-CM | POA: Diagnosis not present

## 2017-09-28 ENCOUNTER — Ambulatory Visit: Payer: BLUE CROSS/BLUE SHIELD | Admitting: Family Medicine

## 2017-09-28 ENCOUNTER — Ambulatory Visit: Payer: BLUE CROSS/BLUE SHIELD

## 2017-09-29 ENCOUNTER — Encounter: Payer: Self-pay | Admitting: Family Medicine

## 2017-09-29 ENCOUNTER — Ambulatory Visit: Payer: BLUE CROSS/BLUE SHIELD | Admitting: Family Medicine

## 2017-09-29 VITALS — BP 130/81 | HR 97 | Temp 97.0°F | Ht 68.0 in | Wt 149.0 lb

## 2017-09-29 DIAGNOSIS — R0789 Other chest pain: Secondary | ICD-10-CM | POA: Diagnosis not present

## 2017-09-29 DIAGNOSIS — F419 Anxiety disorder, unspecified: Secondary | ICD-10-CM | POA: Insufficient documentation

## 2017-09-29 DIAGNOSIS — R002 Palpitations: Secondary | ICD-10-CM

## 2017-09-29 NOTE — Patient Instructions (Signed)
As we discussed, your EKG was completely normal.  We will run gambit of metabolic labs which have been unremarkable.  Your cardiopulmonary exam was completely unremarkable today.  I am somewhat concerned that this is actually underlying panic disorder.  For this reason, we had a nice discussion today with regards to cognitive behavioral therapy and consideration for initiation of medications.  I think that seeing a therapist first is completely acceptable.  If you decide that you would like to pursue medication and that is not offered there, come back to see me and we can discuss the options.  Your provider wants you to schedule an appointment with a Psychologist/Psychiatrist. The following list of offices requires the patient to call and make their own appointment, as there is information they need that only you can provide. Please feel free to choose form the following providers:  [facility]   [phone] Crisis Recovery in [place] [phone]  [facility]  [phone]   [address] [place], [place]  (Scheduled through [facility]) Must call and do an interview for appointment. Sees Children / Accepts Medicaid  [facility]    [phone]  [address]    [place], [place]       [facility]  [phone] [address] [place], [place]  Evaluates for Autism but does not treat it Sees Children / Accepts Medicaid  [facility]    [phone] [address]   [place], [place] Medication management, substance abuse, bipolar, grief, family, marriage, OCD, anxiety, PTSD Sees children / Accepts Medicaid  [facility]    [phone] [address] [place], [place] Sees children / Accepts [facility]  [facility]  [phone] [address] [place], [place]   Dr Estelle GrumblesAkinlayo     [phone] [address] [place], [place]  Sees ADD & ADHD for  treatment Accepts Medicaid  [facility]  [phone] [place], [place] Evaluates for Autism Accepts [facility]  [facility]  [phone] [address] [place], [place]  Does Adult ADD evaluations Does not accept Medicaid  [facility]   [phone]   , [place] Uses animal therapy  Sees children as young as 21 years old [facility]s [facility]  [facility]     [phone]    [address]  [place], [place] [id number] Sees children Accepts Medicaid

## 2017-09-29 NOTE — Progress Notes (Signed)
Subjective: CC: chest tightness, palpitations PCP: Mechele ClaudeStacks, Warren, MD ZOX:WRUEAVWUHPI:Shawn Cooke is a 21 y.o. male presenting to clinic today for:  1. Chest tightness/ Palpitations Patient reports that symptoms are intermittent.  He notes that they seem to be related to work.  He reports that he works at [employer] and that there is a lot of pressure to get things out quickly.  He notes that he is often rushing when the chest tightness and pounding heart rate occur.  He reports that he is able to take a deep breath and during these times it feels like he has difficulty exhaling fully.  He denies loss of consciousness, chest pain, nausea, vomiting, diaphoresis.  No fevers, chills, unplanned weight loss, diarrhea, constipation, difficulty swallowing or change in voice.  He does have a history of anxiety in the past that was related to school and sports.  He was also followed for depressive symptoms in January of this year, which she reports was related to "heartbreak over relationship".  He did not require oral medications during that time.  He has never been hospitalized for mental health disorder.  He denies family history of mental health disorder, including bipolar disorder, anxiety, schizophrenia, depression.  He tolerates exercise without difficulty.  He notes he is very physically active.  He was a former smoker.  No drug use and no alcohol use.  Sleep is fair.  No Known Allergies No past medical history on file. Family History  Family history unknown: Yes   No current outpatient medications on file.  Social Hx: former smoker.  Health Maintenance: Flu shot ROS: Per HPI  Objective: Office vital signs reviewed. BP 130/81   Pulse 97   Temp (!) 97 F (36.1 C) (Oral)   Ht 5\' 8"  (1.727 m)   Wt 149 lb (67.6 kg)   SpO2 100%   BMI 22.66 kg/m   Physical Examination:  General: Awake, alert, well nourished, well appearing male, No acute distress HEENT: Normal    Neck: No masses palpated. No  lymphadenopathy; thyroid not palpable.  No nodules      Eyes: PERRLA, extraocular membranes intact, sclera white; no exophthalmos Cardio: regular rate and rhythm, S1S2 heard, no murmurs appreciated Pulm: clear to auscultation bilaterally, no wheezes, rhonchi or rales; normal work of breathing on room air Psych: Mood stable/happy, speech normal, affect appropriate, good eye contact, thought process normal, does not appear to be responding to internal stimuli.  Depression screen [facility] 2/9 09/29/2017 07/27/2017 07/15/2017  Decreased Interest 0 0 0  Down, Depressed, Hopeless 0 0 0  PHQ - 2 Score 0 0 0   GAD 7 : Generalized Anxiety Score 09/29/2017  Nervous, Anxious, on Edge 2  Control/stop worrying 2  Worry too much - different things 0  Trouble relaxing 3  Restless 2  Easily annoyed or irritable 1  Afraid - awful might happen 2  Total GAD 7 Score 12    Assessment/ Plan: 21 y.o. male   Chest tightness EKG unremarkable.  Chest x-ray obtained previously was unremarkable.  He has been seen in the emergency department for symptoms in the past with unremarkable workup as well.  Tightness seems to be more consistent with anxiety/panic.  See below. - EKG 12-Lead  Heart palpitations EKG unremarkable.  No evidence of arrhythmia or ischemia.  His metabolic workup in the past has been unremarkable as well. - EKG 12-Lead  Anxiety-like symptoms Patient is demonstrating anxiety-like symptoms.  His gad 7 score today was 12.  He  has a history of depression that was treated earlier this year.  He had a fairly extensive workup including chest x-ray, EKG, and metabolic labs which were all unremarkable.  He has no red flags on exam or during history taking.  We did discuss consideration for initiation of medications today.  We also discussed cognitive behavioral therapy/coping mechanisms.  Patient wishes to pursue nonpharmacologic therapy at this time.  A list of local providers was offered to the patient.  He  voiced a great appreciation for this and will follow-up if he desires to pursue medication.  I recommended that he consider following up maybe in about 2-3 months once he has been able to establish with a therapist.  He voiced good understanding and will follow up.   Orders Placed This Encounter  Procedures  . EKG 12-Lead   No orders of the defined types were placed in this encounter.  Total time spent with patient 26 minutes.  Greater than 50% of encounter spent in coordination of care/counseling.   Raliegh IpAshly M Kimiya Brunelle, DO [facility] [phone]

## 2017-09-29 NOTE — Assessment & Plan Note (Signed)
Patient is demonstrating anxiety-like symptoms.  His gad 7 score today was 12.  He has a history of depression that was treated earlier this year.  He had a fairly extensive workup including chest x-ray, EKG, and metabolic labs which were all unremarkable.  He has no red flags on exam or during history taking.  We did discuss consideration for initiation of medications today.  We also discussed cognitive behavioral therapy/coping mechanisms.  Patient wishes to pursue nonpharmacologic therapy at this time.  A list of local providers was offered to the patient.  He voiced a great appreciation for this and will follow-up if he desires to pursue medication.  I recommended that he consider following up maybe in about 2-3 months once he has been able to establish with a therapist.  He voiced good understanding and will follow up.

## 2017-12-19 DIAGNOSIS — Z87891 Personal history of nicotine dependence: Secondary | ICD-10-CM | POA: Diagnosis not present

## 2017-12-19 DIAGNOSIS — J40 Bronchitis, not specified as acute or chronic: Secondary | ICD-10-CM | POA: Diagnosis not present

## 2017-12-19 DIAGNOSIS — F419 Anxiety disorder, unspecified: Secondary | ICD-10-CM | POA: Diagnosis not present

## 2018-01-17 DIAGNOSIS — R1110 Vomiting, unspecified: Secondary | ICD-10-CM | POA: Diagnosis not present

## 2018-01-17 DIAGNOSIS — K2970 Gastritis, unspecified, without bleeding: Secondary | ICD-10-CM | POA: Diagnosis not present

## 2018-01-17 DIAGNOSIS — R1084 Generalized abdominal pain: Secondary | ICD-10-CM | POA: Diagnosis not present

## 2018-02-22 DIAGNOSIS — R109 Unspecified abdominal pain: Secondary | ICD-10-CM | POA: Diagnosis not present

## 2018-02-22 DIAGNOSIS — R1084 Generalized abdominal pain: Secondary | ICD-10-CM | POA: Diagnosis not present

## 2018-02-22 DIAGNOSIS — R112 Nausea with vomiting, unspecified: Secondary | ICD-10-CM | POA: Diagnosis not present

## 2018-02-22 DIAGNOSIS — R42 Dizziness and giddiness: Secondary | ICD-10-CM | POA: Diagnosis not present

## 2018-02-22 DIAGNOSIS — R1110 Vomiting, unspecified: Secondary | ICD-10-CM | POA: Diagnosis not present

## 2018-08-16 ENCOUNTER — Ambulatory Visit: Payer: BLUE CROSS/BLUE SHIELD | Admitting: Family Medicine

## 2018-08-18 ENCOUNTER — Encounter: Payer: Self-pay | Admitting: Family Medicine

## 2018-11-17 DIAGNOSIS — R109 Unspecified abdominal pain: Secondary | ICD-10-CM | POA: Diagnosis not present

## 2018-11-17 DIAGNOSIS — R1084 Generalized abdominal pain: Secondary | ICD-10-CM | POA: Diagnosis not present

## 2018-11-17 DIAGNOSIS — A084 Viral intestinal infection, unspecified: Secondary | ICD-10-CM | POA: Diagnosis not present

## 2019-01-19 ENCOUNTER — Telehealth: Payer: Self-pay | Admitting: Family

## 2019-01-19 ENCOUNTER — Telehealth: Payer: Self-pay | Admitting: Family Medicine

## 2019-01-19 DIAGNOSIS — J111 Influenza due to unidentified influenza virus with other respiratory manifestations: Secondary | ICD-10-CM

## 2019-01-19 MED ORDER — BENZONATATE 100 MG PO CAPS: 100.0000 mg | ORAL_CAPSULE | Freq: Three times a day (TID) | ORAL | 0 refills | Status: DC | PRN

## 2019-01-19 MED ORDER — OSELTAMIVIR PHOSPHATE 75 MG PO CAPS: 75.0000 mg | ORAL_CAPSULE | Freq: Two times a day (BID) | ORAL | 0 refills | Status: DC

## 2019-01-19 MED ORDER — ALBUTEROL SULFATE HFA 108 (90 BASE) MCG/ACT IN AERS: 2.0000 | INHALATION_SPRAY | Freq: Four times a day (QID) | RESPIRATORY_TRACT | 0 refills | Status: DC | PRN

## 2019-01-19 NOTE — Progress Notes (Signed)
Greater than 5 minutes, yet less than 10 minutes of time have been spent researching, coordinating, and implementing care for this patient today.  Thank you for the details you included in the comment boxes. Those details are very helpful in determining the best course of treatment for you and help [place] to provide the best care.  The good news is that the COVID virus typically does not cause body aches. Your symptoms are more consistent with the flu actually. I'm sending treatment for the flu, and INFO on the COVID. Out of 3 levels of risk, you are the lowest risk level with your age and symptoms.   E visit for Flu like symptoms   We are sorry that you are not feeling well.  Here is how we plan to help! Based on what you have shared with me it looks like you may have a respiratory virus that may be influenza.  Influenza or "the flu" is   an infection caused by a respiratory virus. The flu virus is highly contagious and persons who did not receive their yearly flu vaccination may "catch" the flu from close contact.  We have anti-viral medications to treat the viruses that cause this infection. They are not a "cure" and only shorten the course of the infection. These prescriptions are most effective when they are given within the first 2 days of "flu" symptoms. Antiviral medication are indicated if you have a high risk of complications from the flu. You should  also consider an antiviral medication if you are in close contact with someone who is at risk. These medications can help patients avoid complications from the flu  but have side effects that you should know. Possible side effects from Tamiflu or oseltamivir include nausea, vomiting, diarrhea, dizziness, headaches, eye redness, sleep problems or other respiratory symptoms. You should not take Tamiflu if you have an allergy to oseltamivir or any to the ingredients in Tamiflu.  Based upon your symptoms and potential risk factors I have prescribed  Oseltamivir (Tamiflu).  It has been sent to your designated pharmacy.  You will take one 75 mg capsule orally twice a day for the next 5 days.  ANYONE WHO HAS FLU SYMPTOMS SHOULD: . Stay home. The flu is highly contagious and going out or to work exposes others! . Be sure to drink plenty of fluids. Water is fine as well as fruit juices, sodas and electrolyte beverages. You may want to stay away from caffeine or alcohol. If you are nauseated, try taking small sips of liquids. How do you know if you are getting enough fluid? Your urine should be a pale yellow or almost colorless. . Get rest. . Taking a steamy shower or using a humidifier may help nasal congestion and ease sore throat pain. Using a saline nasal spray works much the same way. . Cough drops, hard candies and sore throat lozenges may ease your cough. . Line up a caregiver. Have someone check on you regularly.   GET HELP RIGHT AWAY IF: . You cannot keep down liquids or your medications. . You become short of breath . Your fell like you are going to pass out or loose consciousness. . Your symptoms persist after you have completed your treatment plan MAKE SURE YOU   Understand these instructions.  Will watch your condition.  Will get help right away if you are not doing well or get worse.  ============================================  E-Visit for [facility] Virus Screening Based on your current symptoms, it seems  unlikely that your symptoms are related to the [place] virus.   Coronavirus disease 2019 (COVID-19) is a respiratory illness that can spread from person to person. The virus that causes COVID-19 is a new virus that was first identified in the country of [place] but is now found in multiple other countries and has spread to the [place].  Symptoms associated with the virus are mild to severe fever, cough, and shortness of breath. There is currently no vaccine to protect against COVID-19, and there is no specific antiviral  treatment for the virus.  It is vitally important that if you feel that you have an infection such as this virus or any other virus that you stay home and away from places where you may spread it to others.  Currently, not all patients are being tested. If the symptoms are mild and there is not a known exposure, performing the test is not indicated.  You can use medication such as A prescription cough medication called Tessalon Perles 100 mg. You may take 1-2 capsules every 8 hours as needed for cough and A prescription inhaler called Albuterol MDI 90 mcg /actuation 2 puffs every 4 hours as needed for shortness of breath, wheezing, cough  Reduce your risk of any infection by using the same precautions used for avoiding the common cold or flu:  Marland Kitchen Wash your hands often with soap and warm water for at least 20 seconds.  If soap and water are not readily available, use an alcohol-based hand sanitizer with at least 60% alcohol.  . If coughing or sneezing, cover your mouth and nose by coughing or sneezing into the elbow areas of your shirt or coat, into a tissue or into your sleeve (not your hands). . Avoid shaking hands with others and consider head nods or verbal greetings only. . Avoid touching your eyes, nose, or mouth with unwashed hands.  . Avoid close contact with people who are sick. . Avoid places or events with large numbers of people in one location, like concerts or sporting events. . Carefully consider travel plans you have or are making. . If you are planning any travel outside or inside the [place], visit the CDC's Travelers' Health webpage for the latest health notices. . If you have some symptoms but not all symptoms, continue to monitor at home and seek medical attention if your symptoms worsen. . If you are having a medical emergency, call 911.  HOME CARE . Only take medications as instructed by your medical team. . Drink plenty of fluids and get plenty of rest. . A steam or ultrasonic  humidifier can help if you have congestion.   GET HELP RIGHT AWAY IF: . You develop worsening fever. . You become short of breath . You cough up blood. . Your symptoms become more severe MAKE SURE YOU   Understand these instructions.  Will watch your condition.  Will get help right away if you are not doing well or get worse.  Your e-visit answers were reviewed by a board certified advanced clinical practitioner to complete your personal care plan.  Depending on the condition, your plan could have included both over the counter or prescription medications.  If there is a problem please reply once you have received a response from your provider. Your safety is important to [place].  If you have drug allergies check your prescription carefully.    You can use MyChart to ask questions about today's visit, request a non-urgent call back, or ask  for a work or school excuse for 24 hours related to this e-Visit. If it has been greater than 24 hours you will need to follow up with your provider, or enter a new e-Visit to address those concerns. You will get an e-mail in the next two days asking about your experience.  I hope that your e-visit has been valuable and will speed your recovery. Thank you for using e-visits.

## 2019-01-22 ENCOUNTER — Encounter: Payer: Self-pay | Admitting: Family Medicine

## 2019-01-23 ENCOUNTER — Encounter: Payer: Self-pay | Admitting: *Deleted

## 2019-01-23 ENCOUNTER — Telehealth: Payer: Self-pay | Admitting: Family Medicine

## 2019-01-23 NOTE — Telephone Encounter (Signed)
Work note written and Conservation officer, historic buildings

## 2019-06-15 ENCOUNTER — Other Ambulatory Visit: Payer: Self-pay | Admitting: Family

## 2019-06-15 ENCOUNTER — Other Ambulatory Visit: Payer: Self-pay

## 2019-06-15 ENCOUNTER — Ambulatory Visit: Payer: Self-pay

## 2019-06-15 ENCOUNTER — Encounter: Payer: Self-pay | Admitting: Family

## 2019-06-15 DIAGNOSIS — Z79899 Other long term (current) drug therapy: Secondary | ICD-10-CM

## 2019-06-15 DIAGNOSIS — B20 Human immunodeficiency virus [HIV] disease: Secondary | ICD-10-CM

## 2019-06-15 DIAGNOSIS — Z113 Encounter for screening for infections with a predominantly sexual mode of transmission: Secondary | ICD-10-CM

## 2019-06-16 LAB — T-HELPER CELL (CD4) - (RCID CLINIC ONLY)
CD4 % Helper T Cell: 30 % — ABNORMAL LOW (ref 33–65)
CD4 T Cell Abs: 796 /uL (ref 400–1790)

## 2019-06-17 LAB — URINE CYTOLOGY ANCILLARY ONLY
Chlamydia: NEGATIVE
Neisseria Gonorrhea: NEGATIVE

## 2019-07-03 ENCOUNTER — Ambulatory Visit (INDEPENDENT_AMBULATORY_CARE_PROVIDER_SITE_OTHER): Payer: Self-pay | Admitting: Family

## 2019-07-03 ENCOUNTER — Other Ambulatory Visit: Payer: Self-pay

## 2019-07-03 ENCOUNTER — Ambulatory Visit (INDEPENDENT_AMBULATORY_CARE_PROVIDER_SITE_OTHER): Payer: Self-pay | Admitting: Pharmacist

## 2019-07-03 ENCOUNTER — Encounter: Payer: Self-pay | Admitting: Family

## 2019-07-03 ENCOUNTER — Telehealth: Payer: Self-pay | Admitting: Pharmacy Technician

## 2019-07-03 VITALS — BP 123/76 | HR 96 | Temp 98.7°F | Ht 68.0 in

## 2019-07-03 DIAGNOSIS — F322 Major depressive disorder, single episode, severe without psychotic features: Secondary | ICD-10-CM

## 2019-07-03 DIAGNOSIS — B20 Human immunodeficiency virus [HIV] disease: Secondary | ICD-10-CM

## 2019-07-03 DIAGNOSIS — Z720 Tobacco use: Secondary | ICD-10-CM | POA: Insufficient documentation

## 2019-07-03 MED ORDER — BICTEGRAVIR-EMTRICITAB-TENOFOV 50-200-25 MG PO TABS: 1.0000 | ORAL_TABLET | Freq: Every day | ORAL | 2 refills | Status: DC

## 2019-07-03 MED FILL — BIKTARVY 50-200-25 MG TABS: 50-200-25 | 30 days supply | Qty: 30 | Fill #0

## 2019-07-03 NOTE — Progress Notes (Signed)
HPI: Shawn Cooke is a 23 y.o. male who presents to the [facility] clinic today to initiate care with Marya Amsler for his newly diagnosed HIV infection.  Patient Active Problem List   Diagnosis Date Noted  . Anxiety-like symptoms 09/29/2017  . Major depressive disorder, single episode, severe ([place]) 11/05/2016    Patient's Medications  New Prescriptions   No medications on file  Previous Medications   BICTEGRAVIR-EMTRICITABINE-TENOFOVIR AF (BIKTARVY) 50-200-25 MG TABS TABLET    Take 1 tablet by mouth daily.  Modified Medications   No medications on file  Discontinued Medications   No medications on file    Allergies: No Known Allergies  Past Medical History: Past Medical History:  Diagnosis Date  . HIV infection ([place])   . Influenza     Social History: Social History   Socioeconomic History  . Marital status: Married    Spouse name: Not on file  . Number of children: Not on file  . Years of education: 38  . Highest education level: Not on file  Occupational History  . Not on file  Social Needs  . Financial resource strain: Not on file  . Food insecurity    Worry: Not on file    Inability: Not on file  . Transportation needs    Medical: Not on file    Non-medical: Not on file  Tobacco Use  . Smoking status: Current Every Day Smoker    Packs/day: 0.25    Years: 0.50    Pack years: 0.12    Types: Cigarettes  . Smokeless tobacco: Never Used  Substance and Sexual Activity  . Alcohol use: Yes    Alcohol/week: 0.0 standard drinks    Comment: Occasional   . Drug use: Yes    Types: Marijuana  . Sexual activity: Not on file  Lifestyle  . Physical activity    Days per week: Not on file    Minutes per session: Not on file  . Stress: Not on file  Relationships  . Social Herbalist on phone: Not on file    Gets together: Not on file    Attends religious service: Not on file    Active member of club or organization: Not on file    Attends meetings of  clubs or organizations: Not on file    Relationship status: Not on file  Other Topics Concern  . Not on file  Social History Narrative  . Not on file    Labs: Lab Results  Component Value Date   CD4TABS 796 06/15/2019    RPR and STI Lab Results  Component Value Date   LABRPR NON-REACTIVE 06/15/2019    STI Results GC CT  06/15/2019 Negative Negative    Hepatitis B Lab Results  Component Value Date   HEPBSAB NON-REACTIVE 06/15/2019   HEPBSAG NON-REACTIVE 06/15/2019   HEPBCAB NON-REACTIVE 06/15/2019   Hepatitis C Lab Results  Component Value Date   HEPCAB NON-REACTIVE 06/15/2019   Hepatitis A Lab Results  Component Value Date   HAV REACTIVE (A) 06/15/2019   Lipids: Lab Results  Component Value Date   CHOL 72 06/15/2019   TRIG 75 06/15/2019   HDL 19 (L) 06/15/2019   CHOLHDL 3.8 06/15/2019   LDLCALC 37 06/15/2019    Current HIV Regimen: Treatment naive  Assessment: Shawn Cooke is here today to initiate care with Marya Amsler for his newly diagnosed HIV infection.  He is treatment naive and his initial HIV viral load is pending and his  CD4 count is 796. Genotype is also pending. Will start patient on Biktarvy.  Explained that Susanne BordersBiktarvy is a one pill once daily medication with or without food and the importance of not missing any doses. Explained resistance and how it develops and why it is so important to take Biktarvy daily and not skip days or doses. Counseled patient to take it around the same time each day. Counseled on what to do if dose is missed, if closer to missed dose take immediately, if closer to next dose then skip and resume normal schedule.   Cautioned on possible side effects the first week or so including nausea, diarrhea, dizziness, and headaches but that they should resolve after the first couple of weeks. I reviewed patient medications and found no drug interactions. Counseled patient to separate Biktarvy from divalent cations including multivitamins.  Discussed with patient to call clinic if he starts a new medication or herbal supplement. I gave the patient my card and told him to call me with any issues/questions/concerns.  Patient is insured and will get it mailed to his mother's house [facility]   Plan: - Start Biktarvy PO once daily - Mail to mothers house ([address] [place] [id number])  Juliona Vales L. Chalmer Zheng, PharmD, BCIDP, AAHIVP, CPP Infectious Diseases Clinical Pharmacist [facility] 07/03/2019, 2:45 PM

## 2019-07-03 NOTE — Telephone Encounter (Signed)
[facility] Patient Advocate Encounter   Was successful in obtaining a [place] copay card for [employer]. This copay card will make the patients copay $0.  I have spoken with the patient and he will have it mailed to his home tomorrow.    The billing information is as follows and has been shared with [place]. Physical address provided to the pharmacy for shipping purposes.  RxBin: [id number] PCN: [id number] Member ID: [id number] Group ID: [id number]  Bartholomew Crews, [place] Patient [facility] Phone: [phone] Fax: [phone] 07/03/2019 3:02 PM

## 2019-07-03 NOTE — Telephone Encounter (Signed)
[facility] Patient Advocate Encounter    Findings of the benefits investigation conducted this morning via test claims for the patient's upcoming appointment on 07/03/2019 are as follows:   Insurance: [employer] active commercial Estimated copay amount: $100.00 (will need a copay card) Prior Authorization: not required  [facility] Patient Advocate will follow up once patient arrives for their appointment. After 3 local refills, mail order is required per patient's insurance [place], [place] [facility]ious Disease Phone: [phone] Fax: [phone] 07/03/2019 8:34 AM

## 2019-07-03 NOTE — Assessment & Plan Note (Signed)
Mr. Saab has been smoking tobacco for less than 1 year.  Discussed importance of tobacco cessation to reduce risk of respiratory, cardiovascular, and malignant disease in the future.  He is in the precontemplation stage of quitting and not ready to quit at this time.  Continue to monitor.

## 2019-07-03 NOTE — Progress Notes (Signed)
Subjective:    Patient ID: Shawn Cooke, male    DOB: 09/15/1996, 23 y.o.   MRN: [id number]  Chief Complaint  Patient presents with  . HIV Positive/AIDS    HPI:  Shawn Cooke is a 23 y.o. male referred by the health department for positive HIV test.  Shawn Cooke was initially diagnosed with HIV on 05/25/2019 when he was tested at the Health Department and found to have a positive HIV test with confirmatory HIV-1 result.  He is currently married with his partner positive for HIV and newly diagnosed who was not on medication for a brief period of time likely resulting in his current infection.  Risk factors for acquiring HIV include by sexual contact.  He has informed his mother of his positive status and she is supportive.  He has several concerns/questions regarding HIV.  He has had some weight loss recently as well as new onset diarrhea in the last 2 weeks. Denies fevers, chills, night sweats, headaches, changes in vision, neck pain/stiffness, nausea,, vomiting, lesions or rashes.  Shawn Cooke continues to work currently full-time at [employer] with plans for transition to [employer].  He does smoke approximately 1/4 pack of cigarettes per day which she has been doing for less than 1 year.  He drinks alcohol on occasion and smokes marijuana regularly.  He has no family history of mental health issues.  Denies feelings of being down, depressed, or hopeless recently.  Initial clinic blood work with a CD4 nadir of 796 dnd viral load remains pending.  RPR was nonreactive for syphilis.  Urine was negative for gonorrhea and chlamydia.  He is not immune to hepatitis B and has no active infection with hepatitis B or C.  QuantiFERON gold test negative.  Kidney function, liver function, and electrolytes within normal ranges.   No Known Allergies    Outpatient Medications Prior to Visit  Medication Sig Dispense Refill  . albuterol (PROVENTIL HFA;VENTOLIN HFA) 108 (90 Base) MCG/ACT inhaler  Inhale 2 puffs into the lungs every 6 (six) hours as needed for shortness of breath. (Patient not taking: Reported on 07/03/2019) 1 Inhaler 0  . benzonatate (TESSALON PERLES) 100 MG capsule Take 1-2 capsules (100-200 mg total) by mouth every 8 (eight) hours as needed for cough. (Patient not taking: Reported on 07/03/2019) 30 capsule 0  . oseltamivir (TAMIFLU) 75 MG capsule Take 1 capsule (75 mg total) by mouth 2 (two) times daily. (Patient not taking: Reported on 07/03/2019) 10 capsule 0   No facility-administered medications prior to visit.      Past Medical History:  Diagnosis Date  . HIV infection (HCC)   . Influenza       Past Surgical History:  Procedure Laterality Date  . NO PAST SURGERIES        Family History  Problem Relation Age of Onset  . Lung cancer Maternal Grandfather       Social History   Socioeconomic History  . Marital status: Married    Spouse name: Not on file  . Number of children: Not on file  . Years of education: 3412  . Highest education level: Not on file  Occupational History  . Not on file  Social Needs  . Financial resource strain: Not on file  . Food insecurity    Worry: Not on file    Inability: Not on file  . Transportation needs    Medical: Not on file    Non-medical: Not on file  Tobacco  Use  . Smoking status: Current Every Day Smoker    Packs/day: 0.25    Years: 0.50    Pack years: 0.12    Types: Cigarettes  . Smokeless tobacco: Never Used  Substance and Sexual Activity  . Alcohol use: Yes    Alcohol/week: 0.0 standard drinks    Comment: Occasional   . Drug use: Yes    Types: Marijuana  . Sexual activity: Not on file  Lifestyle  . Physical activity    Days per week: Not on file    Minutes per session: Not on file  . Stress: Not on file  Relationships  . Social Herbalist on phone: Not on file    Gets together: Not on file    Attends religious service: Not on file    Active member of club or organization:  Not on file    Attends meetings of clubs or organizations: Not on file    Relationship status: Not on file  . Intimate partner violence    Fear of current or ex partner: Not on file    Emotionally abused: Not on file    Physically abused: Not on file    Forced sexual activity: Not on file  Other Topics Concern  . Not on file  Social History Narrative  . Not on file      Review of Systems  Constitutional: Negative for appetite change, chills, fatigue, fever and unexpected weight change.  Eyes: Negative for visual disturbance.  Respiratory: Negative for cough, chest tightness, shortness of breath and wheezing.   Cardiovascular: Negative for chest pain and leg swelling.  Gastrointestinal: Negative for abdominal pain, constipation, diarrhea, nausea and vomiting.  Genitourinary: Negative for dysuria, flank pain, frequency, genital sores, hematuria and urgency.  Skin: Negative for rash.  Allergic/Immunologic: Negative for immunocompromised state.  Neurological: Negative for dizziness and headaches.       Objective:    BP 123/76   Pulse 96   Temp 98.7 F (37.1 C)   Ht 5\' 8"  (1.727 m)   BMI 22.66 kg/m  Nursing note and vital signs reviewed.  Physical Exam Constitutional:      General: He is not in acute distress.    Appearance: He is well-developed.  Eyes:     Conjunctiva/sclera: Conjunctivae normal.  Neck:     Musculoskeletal: Neck supple.  Cardiovascular:     Rate and Rhythm: Normal rate and regular rhythm.     Heart sounds: Normal heart sounds. No murmur. No friction rub. No gallop.   Pulmonary:     Effort: Pulmonary effort is normal. No respiratory distress.     Breath sounds: Normal breath sounds. No wheezing or rales.  Chest:     Chest wall: No tenderness.  Abdominal:     General: Bowel sounds are normal.     Palpations: Abdomen is soft.     Tenderness: There is no abdominal tenderness.  Lymphadenopathy:     Cervical: No cervical adenopathy.  Skin:     General: Skin is warm and dry.     Findings: No rash.  Neurological:     Mental Status: He is alert and oriented to person, place, and time.  Psychiatric:        Behavior: Behavior normal.        Thought Content: Thought content normal.        Judgment: Judgment normal.         Assessment & Plan:   Problem List Items Addressed  This Visit      Other   Major depressive disorder, single episode, severe [facility])    Mr. Igou was previously diagnosed with depression and has no current symptoms or signs of exacerbation today.  Continue to monitor.      HIV disease [facility])    Mr. 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  Initial CD4 counts/nadir of 796.  Initial viral load remains pending.  He has no signs/symptoms of opportunistic infection.  We discussed the pathogenesis, risks if left untreated, transmission, prevention, and treatments for HIV disease.  He felt much better following this conversation.  He is currently insured through [facility] and we will start him on [place].  He was able to meet with our pharmacy staff for orientation purposes.  Plan to continue Biktarvy for 1 month and follow-up for blood work at the next office visit.      Relevant Medications   bictegravir-emtricitabine-tenofovir AF (BIKTARVY) 50-200-25 MG TABS tablet   Tobacco use    Mr. Merriman has been smoking tobacco for less than 1 year.  Discussed importance of tobacco cessation to reduce risk of respiratory, cardiovascular, and malignant disease in the future.  He is in the precontemplation stage of quitting and not ready to quit at this time.  Continue to monitor.          I have discontinued Amarian M. Wandel's benzonatate, albuterol, and oseltamivir. I am also having him start on bictegravir-emtricitabine-tenofovir AF.   Meds ordered this encounter  Medications  . bictegravir-emtricitabine-tenofovir AF (BIKTARVY) 50-200-25 MG TABS tablet     Sig: Take 1 tablet by mouth daily.    Dispense:  30 tablet    Refill:  2    Order Specific Question:   Supervising Provider    Answer:   Judyann Munson [[id number]]     Follow-up: Return in about 1 month (around 08/02/2019).    Marcos Eke, MSN, FNP-C Nurse Practitioner [facility] for Infectious Disease [facility] Office phone: [phone] Pager: [phone] [facility] Main number: [phone]

## 2019-07-03 NOTE — Assessment & Plan Note (Signed)
Shawn Cooke is a 23 year old male newly diagnosed with HIV-1 through by sexual contact.  Initial CD4 counts/nadir of 796.  Initial viral load remains pending.  He has no signs/symptoms of opportunistic infection.  We discussed the pathogenesis, risks if left untreated, transmission, prevention, and treatments for HIV disease.  He felt much better following this conversation.  He is currently insured through [facility] and we will start him on [place].  He was able to meet with our pharmacy staff for orientation purposes.  Plan to continue [place] for 1 month and follow-up for blood work at the next office visit.

## 2019-07-03 NOTE — Assessment & Plan Note (Signed)
Shawn Cooke was previously diagnosed with depression and has no current symptoms or signs of exacerbation today.  Continue to monitor.

## 2019-07-03 NOTE — Patient Instructions (Signed)
Nice to meet you!  We will get you started on Biktarvy.  Please let us know if you have any questions or concerns that may arise.  We will plan to follow up with you in 1 month or sooner if needed and will plan to check your blood work at that time.   Have a great day and stay safe!

## 2019-07-05 LAB — QUANTIFERON-TB GOLD PLUS
Mitogen-NIL: >10 IU/mL
NIL: 0.02 IU/mL
QuantiFERON-TB Gold Plus: NEGATIVE
TB1-NIL: 0.01 IU/mL
TB2-NIL: 0.01 IU/mL

## 2019-07-05 LAB — CBC WITH DIFFERENTIAL/PLATELET
Absolute Monocytes: 367 cells/uL (ref 200–950)
Basophils Absolute: 38 cells/uL (ref 0–200)
Basophils Relative: 0.7 %
Eosinophils Absolute: 238 cells/uL (ref 15–500)
Eosinophils Relative: 4.4 %
HCT: 40.5 % (ref 38.5–50.0)
Hemoglobin: 13.2 g/dL (ref 13.2–17.1)
Lymphs Abs: 3235 cells/uL (ref 850–3900)
MCH: 26.9 pg — ABNORMAL LOW (ref 27.0–33.0)
MCHC: 32.6 g/dL (ref 32.0–36.0)
MCV: 82.7 fL (ref 80.0–100.0)
MPV: 10.2 fL (ref 7.5–12.5)
Monocytes Relative: 6.8 %
Neutro Abs: 1523 cells/uL (ref 1500–7800)
Neutrophils Relative %: 28.2 %
Platelets: 291 10*3/uL (ref 140–400)
RBC: 4.9 10*6/uL (ref 4.20–5.80)
RDW: 12.7 % (ref 11.0–15.0)
Total Lymphocyte: 59.9 %
WBC: 5.4 10*3/uL (ref 3.8–10.8)

## 2019-07-05 LAB — URINALYSIS
Bilirubin Urine: NEGATIVE
Glucose, UA: NEGATIVE
Hgb urine dipstick: NEGATIVE
Ketones, ur: NEGATIVE
Leukocytes,Ua: NEGATIVE
Nitrite: NEGATIVE
Protein, ur: NEGATIVE
Specific Gravity, Urine: 1.014 (ref 1.001–1.03)
pH: 5.5 (ref 5.0–8.0)

## 2019-07-05 LAB — HIV-1/2 AB - DIFFERENTIATION
HIV-1 antibody: POSITIVE — AB
HIV-2 Ab: NEGATIVE

## 2019-07-05 LAB — HIV ANTIBODY (ROUTINE TESTING W REFLEX): HIV 1&2 Ab, 4th Generation: REACTIVE — AB

## 2019-07-05 LAB — COMPLETE METABOLIC PANEL WITH GFR
AG Ratio: 1.2 (calc) (ref 1.0–2.5)
ALT: 56 U/L — ABNORMAL HIGH (ref 9–46)
AST: 34 U/L (ref 10–40)
Albumin: 4.1 g/dL (ref 3.6–5.1)
Alkaline phosphatase (APISO): 53 U/L (ref 36–130)
BUN: 10 mg/dL (ref 7–25)
CO2: 28 mmol/L (ref 20–32)
Calcium: 9.4 mg/dL (ref 8.6–10.3)
Chloride: 105 mmol/L (ref 98–110)
Creat: 0.83 mg/dL (ref 0.60–1.35)
GFR, Est African American: 145 mL/min/{1.73_m2} (ref >=60)
GFR, Est Non African American: 125 mL/min/{1.73_m2} (ref >=60)
Globulin: 3.3 g/dL (calc) (ref 1.9–3.7)
Glucose, Bld: 84 mg/dL (ref 65–99)
Potassium: 3.8 mmol/L (ref 3.5–5.3)
Sodium: 139 mmol/L (ref 135–146)
Total Bilirubin: 0.5 mg/dL (ref 0.2–1.2)
Total Protein: 7.4 g/dL (ref 6.1–8.1)

## 2019-07-05 LAB — LIPID PANEL
Cholesterol: 72 mg/dL (ref <200)
HDL: 19 mg/dL — ABNORMAL LOW (ref >=40)
LDL Cholesterol (Calc): 37 mg/dL (calc)
Non-HDL Cholesterol (Calc): 53 mg/dL (calc) (ref <130)
Total CHOL/HDL Ratio: 3.8 (calc) (ref <5.0)
Triglycerides: 75 mg/dL (ref <150)

## 2019-07-05 LAB — HEPATITIS C ANTIBODY
Hepatitis C Ab: NONREACTIVE
SIGNAL TO CUT-OFF: 0.06 (ref <1.00)

## 2019-07-05 LAB — RPR: RPR Ser Ql: NONREACTIVE

## 2019-07-05 LAB — HLA B*5701: HLA-B*5701 w/rflx HLA-B High: NEGATIVE

## 2019-07-05 LAB — HIV-1 GENOTYPE: HIV-1 Genotype: DETECTED — AB

## 2019-07-05 LAB — HEPATITIS B CORE ANTIBODY, TOTAL: Hep B Core Total Ab: NONREACTIVE

## 2019-07-05 LAB — HEPATITIS B SURFACE ANTIGEN: Hepatitis B Surface Ag: NONREACTIVE

## 2019-07-05 LAB — HIV-1 RNA ULTRAQUANT REFLEX TO GENTYP+
HIV 1 RNA Quant: 232000 copies/mL — ABNORMAL HIGH
HIV-1 RNA Quant, Log: 5.37 Log copies/mL — ABNORMAL HIGH

## 2019-07-05 LAB — HEPATITIS B SURFACE ANTIBODY,QUALITATIVE: Hep B S Ab: NONREACTIVE

## 2019-07-05 LAB — HEPATITIS A ANTIBODY, TOTAL: Hepatitis A AB,Total: REACTIVE — AB

## 2019-07-27 MED FILL — BIKTARVY 50-200-25 MG TABS: 50-200-25 | 30 days supply | Qty: 30 | Fill #1

## 2019-08-03 ENCOUNTER — Ambulatory Visit (INDEPENDENT_AMBULATORY_CARE_PROVIDER_SITE_OTHER): Payer: Self-pay | Admitting: Family

## 2019-08-03 ENCOUNTER — Encounter: Payer: Self-pay | Admitting: Family

## 2019-08-03 ENCOUNTER — Other Ambulatory Visit: Payer: Self-pay

## 2019-08-03 VITALS — BP 137/81 | HR 66 | Temp 98.0°F

## 2019-08-03 DIAGNOSIS — Z720 Tobacco use: Secondary | ICD-10-CM

## 2019-08-03 DIAGNOSIS — B20 Human immunodeficiency virus [HIV] disease: Secondary | ICD-10-CM

## 2019-08-03 DIAGNOSIS — Z23 Encounter for immunization: Secondary | ICD-10-CM

## 2019-08-03 DIAGNOSIS — Z0000 Encounter for general adult medical examination without abnormal findings: Secondary | ICD-10-CM | POA: Insufficient documentation

## 2019-08-03 MED ORDER — BICTEGRAVIR-EMTRICITAB-TENOFOV 50-200-25 MG PO TABS: 1.0000 | ORAL_TABLET | Freq: Every day | ORAL | 2 refills | Status: DC

## 2019-08-03 NOTE — Assessment & Plan Note (Signed)
   Influenza updated today.  Will need Prevnar, Menveo, and Pneumovax in the future.  Discussed importance of safe sexual practice to reduce risk of acquisition/transmission of STI.  He has a Pharmacist, community and will follow-up for routine dental care.

## 2019-08-03 NOTE — Progress Notes (Signed)
Subjective:    Patient ID: Shawn Cooke, male    DOB: [birth year], 23 y.o.   MRN: [id number]  Chief Complaint  Patient presents with  . HIV Positive/AIDS     HPI:  Shawn Cooke is a 23 y.o. male with HIV disease who was last seen in the office on 07/03/19 with newly diagnosed HIV disease with CD4 nadir of 796 with viral load of 232,000.  He was started on Biktarvy at tjat time. Healthcare maintenance due includes influenza vaccination, Prevnar, Pneumovax, and Menveo.  Shawn Cooke has been taking his Biktarvy as prescribed with no adverse side effects or missed doses.  He has continued to experience some diarrhea with a frequency of 1-2 times per day on average.  This remains unchanged from previous since starting medication. Denies fevers, chills, night sweats, headaches, changes in vision, neck pain/stiffness, nausea, vomiting, lesions or rashes.  Shawn Cooke has no problems obtaining his medication from the pharmacy and is covered through [employer].  He is currently working 2 jobs and has recently quit smoking.  No recreational or illicit drug use or alcohol consumption.  Not currently sexually active.  Denies feelings of being down, depressed, or hopeless recently.  He has ambitions to discover green energy.   No Known Allergies    Outpatient Medications Prior to Visit  Medication Sig Dispense Refill  . bictegravir-emtricitabine-tenofovir AF (BIKTARVY) 50-200-25 MG TABS tablet Take 1 tablet by mouth daily. 30 tablet 2   No facility-administered medications prior to visit.      Past Medical History:  Diagnosis Date  . HIV infection ([place])   . Influenza      Past Surgical History:  Procedure Laterality Date  . NO PAST SURGERIES      Review of Systems  Constitutional: Negative for appetite change, chills, fatigue, fever and unexpected weight change.  Eyes: Negative for visual disturbance.  Respiratory: Negative for cough, chest tightness, shortness of breath  and wheezing.   Cardiovascular: Negative for chest pain and leg swelling.  Gastrointestinal: Negative for abdominal pain, constipation, diarrhea, nausea and vomiting.  Genitourinary: Negative for dysuria, flank pain, frequency, genital sores, hematuria and urgency.  Skin: Negative for rash.  Allergic/Immunologic: Negative for immunocompromised state.  Neurological: Negative for dizziness and headaches.      Objective:    BP 137/81   Pulse 66   Temp 98 F (36.7 C)  Nursing note and vital signs reviewed.  Physical Exam Constitutional:      General: He is not in acute distress.    Appearance: He is well-developed.  Eyes:     Conjunctiva/sclera: Conjunctivae normal.  Neck:     Musculoskeletal: Neck supple.  Cardiovascular:     Rate and Rhythm: Normal rate and regular rhythm.     Heart sounds: Normal heart sounds. No murmur. No friction rub. No gallop.   Pulmonary:     Effort: Pulmonary effort is normal. No respiratory distress.     Breath sounds: Normal breath sounds. No wheezing or rales.  Chest:     Chest wall: No tenderness.  Abdominal:     General: Bowel sounds are normal.     Palpations: Abdomen is soft.     Tenderness: There is no abdominal tenderness.  Lymphadenopathy:     Cervical: No cervical adenopathy.  Skin:    General: Skin is warm and dry.     Findings: No rash.  Neurological:     Mental Status: He is alert and oriented to person,  place, and time.  Psychiatric:        Behavior: Behavior normal.        Thought Content: Thought content normal.        Judgment: Judgment normal.      Depression screen [facility] 2/9 08/03/2019 07/03/2019 09/29/2017 07/27/2017 07/15/2017  Decreased Interest 1 1 0 0 0  Down, Depressed, Hopeless 1 1 0 0 0  PHQ - 2 Score 2 2 0 0 0  Altered sleeping - 1 - - -  Tired, decreased energy - 1 - - -  Change in appetite - 1 - - -  Feeling bad or failure about yourself  - 1 - - -  Trouble concentrating - 1 - - -  Moving slowly or  fidgety/restless - 1 - - -  PHQ-9 Score - 8 - - -       Assessment & Plan:    Patient Active Problem List   Diagnosis Date Noted  . Healthcare maintenance 08/03/2019  . HIV disease (HCC) 07/03/2019  . Tobacco use 07/03/2019  . Anxiety-like symptoms 09/29/2017  . Major depressive disorder, single episode, severe (HCC) 11/05/2016     Problem List Items Addressed This Visit      Other   HIV disease (HCC) - Primary    Mr. Shawn Cooke appears to be doing well with his first month of Biktarvy with no significant adverse side effects or missed doses.  He has no signs/symptoms of opportunistic infection or progressive HIV disease.  Discussed U=U in the meanings of being undetectable with the goal of being unable to transmit the virus as well as reduce risk of progression/complication in the future.  Check lab work today.  Continue current dose of Biktarvy.  Plan for follow-up in 6 weeks or sooner if needed with lab work 1 to 2 weeks prior to appointment.      Relevant Medications   bictegravir-emtricitabine-tenofovir AF (BIKTARVY) 50-200-25 MG TABS tablet   Other Relevant Orders   COMPLETE METABOLIC PANEL WITH GFR   HIV-1 RNA quant-no reflex-bld   T-helper cell (CD4)- ([facility] clinic only)   T-helper cell (CD4)- ([facility] clinic only)   HIV-1 RNA quant-no reflex-bld   Tobacco use    He is now a former tobacco smoker x1 month.  Congratulated on this accomplishment.  Encouraged to continue with stress relief measures to avoid relapse and tobacco use.      Healthcare maintenance     Influenza updated today.  Will need Prevnar, Menveo, and Pneumovax in the future.  Discussed importance of safe sexual practice to reduce risk of acquisition/transmission of STI.  He has a Education officer, community and will follow-up for routine dental care.       Other Visit Diagnoses    Need for immunization against influenza       Relevant Orders   Flu Vaccine QUAD 36+ mos IM (Completed)      I am having [name] maintain his bictegravir-emtricitabine-tenofovir AF.   Meds ordered this encounter  Medications  . bictegravir-emtricitabine-tenofovir AF (BIKTARVY) 50-200-25 MG TABS tablet    Sig: Take 1 tablet by mouth daily.    Dispense:  30 tablet    Refill:  2    Order Specific Question:   Supervising Provider    Answer:   Judyann Munson [[id number]]     Follow-up: Return in about 6 weeks (around 09/14/2019), or if symptoms worsen or fail to improve.   Marcos Eke, MSN, FNP-C Nurse Practitioner [facility] for Infectious Disease  [place] number: [phone]

## 2019-08-03 NOTE — Assessment & Plan Note (Signed)
Shawn Cooke appears to be doing well with his first month of [place] with no significant adverse side effects or missed doses.  He has no signs/symptoms of opportunistic infection or progressive HIV disease.  Discussed U=U in the meanings of being undetectable with the goal of being unable to transmit the virus as well as reduce risk of progression/complication in the future.  Check lab work today.  Continue current dose of Biktarvy.  Plan for follow-up in 6 weeks or sooner if needed with lab work 1 to 2 weeks prior to appointment.

## 2019-08-03 NOTE — Patient Instructions (Signed)
Nice to see you.  Continue to take your [place] as prescribed.  We will check your lab work today.  Plan for follow up in 6 weeks with blood work 1-2 weeks prior to your appointment.  Have a great day and stay safe!

## 2019-08-03 NOTE — Assessment & Plan Note (Signed)
He is now a former tobacco smoker x1 month.  Congratulated on this accomplishment.  Encouraged to continue with stress relief measures to avoid relapse and tobacco use.

## 2019-08-04 LAB — T-HELPER CELL (CD4) - (RCID CLINIC ONLY)
CD4 % Helper T Cell: 28 % — ABNORMAL LOW (ref 33–65)
CD4 T Cell Abs: 849 /uL (ref 400–1790)

## 2019-08-06 LAB — COMPLETE METABOLIC PANEL WITH GFR
AG Ratio: 1.3 (calc) (ref 1.0–2.5)
ALT: 29 U/L (ref 9–46)
AST: 19 U/L (ref 10–40)
Albumin: 4 g/dL (ref 3.6–5.1)
Alkaline phosphatase (APISO): 51 U/L (ref 36–130)
BUN: 11 mg/dL (ref 7–25)
CO2: 31 mmol/L (ref 20–32)
Calcium: 9.5 mg/dL (ref 8.6–10.3)
Chloride: 105 mmol/L (ref 98–110)
Creat: 1.02 mg/dL (ref 0.60–1.35)
GFR, Est African American: 120 mL/min/{1.73_m2} (ref >=60)
GFR, Est Non African American: 104 mL/min/{1.73_m2} (ref >=60)
Globulin: 3.1 g/dL (calc) (ref 1.9–3.7)
Glucose, Bld: 91 mg/dL (ref 65–99)
Potassium: 4.2 mmol/L (ref 3.5–5.3)
Sodium: 140 mmol/L (ref 135–146)
Total Bilirubin: 0.4 mg/dL (ref 0.2–1.2)
Total Protein: 7.1 g/dL (ref 6.1–8.1)

## 2019-08-06 LAB — HIV-1 RNA QUANT-NO REFLEX-BLD
HIV 1 RNA Quant: 198 copies/mL — ABNORMAL HIGH
HIV-1 RNA Quant, Log: 2.3 Log copies/mL — ABNORMAL HIGH

## 2019-08-31 MED FILL — BIKTARVY 50-200-25 MG TABS: 50-200-25 | 30 days supply | Qty: 30 | Fill #0

## 2019-09-14 ENCOUNTER — Other Ambulatory Visit: Payer: Self-pay

## 2019-09-14 DIAGNOSIS — B20 Human immunodeficiency virus [HIV] disease: Secondary | ICD-10-CM

## 2019-09-15 LAB — T-HELPER CELL (CD4) - (RCID CLINIC ONLY)
CD4 % Helper T Cell: 27 % — ABNORMAL LOW (ref 33–65)
CD4 T Cell Abs: 720 /uL (ref 400–1790)

## 2019-09-21 LAB — HIV-1 RNA QUANT-NO REFLEX-BLD
HIV 1 RNA Quant: <20 copies/mL — AB
HIV-1 RNA Quant, Log: <1.3 Log copies/mL — AB

## 2019-09-26 ENCOUNTER — Telehealth: Payer: Self-pay | Admitting: Pharmacy Technician

## 2019-09-26 MED FILL — BIKTARVY 50-200-25 MG TABS: 50-200-25 | 30 days supply | Qty: 30 | Fill #1

## 2019-09-26 NOTE — Telephone Encounter (Addendum)
[facility] Patient Advocate Encounter  Patient's insurance company is requiring him to use mail order services now that he has already used his 3 local pharmacy refills. He is instructed to call [phone] to setup his mail order account. We will need to transfer his script to [place] once he gets setup for mail order.   I called and left a voicemail for the patient to see if he wants this immediate refill to be filled at [facility] so he does not miss any doses. It is able to be done but will use $3025 of his [place] copay card. This copay card will reset funds January 1. Thus far, it has only used $300 of the copay card. He also has an appointment on 11/25, we can talk to him about the changes at that appointment.

## 2019-09-27 ENCOUNTER — Encounter: Payer: Self-pay | Admitting: Family

## 2019-09-27 ENCOUNTER — Ambulatory Visit (INDEPENDENT_AMBULATORY_CARE_PROVIDER_SITE_OTHER): Payer: Self-pay | Admitting: Family

## 2019-09-27 ENCOUNTER — Other Ambulatory Visit: Payer: Self-pay

## 2019-09-27 VITALS — BP 129/85 | HR 65 | Temp 98.4°F | Wt 148.0 lb

## 2019-09-27 DIAGNOSIS — F322 Major depressive disorder, single episode, severe without psychotic features: Secondary | ICD-10-CM

## 2019-09-27 DIAGNOSIS — B20 Human immunodeficiency virus [HIV] disease: Secondary | ICD-10-CM

## 2019-09-27 DIAGNOSIS — Z113 Encounter for screening for infections with a predominantly sexual mode of transmission: Secondary | ICD-10-CM

## 2019-09-27 DIAGNOSIS — Z0000 Encounter for general adult medical examination without abnormal findings: Secondary | ICD-10-CM

## 2019-09-27 MED ORDER — BICTEGRAVIR-EMTRICITAB-TENOFOV 50-200-25 MG PO TABS: 1.0000 | ORAL_TABLET | Freq: Every day | ORAL | 2 refills | Status: DC

## 2019-09-27 NOTE — Patient Instructions (Addendum)
Nice to see you.  Please continue to take your [place] as prescribed.  Refills will be sent to the pharmacy.  If you are needing assistance   [facility] [address], [place], [place] [address] [phone]  Plan for follow up in 3 months or sooner if needed with lab work 1-2 weeks prior to appointment.   Have a great day and stay safe!

## 2019-09-27 NOTE — Assessment & Plan Note (Signed)
Shawn Cooke has well-controlled HIV disease with good adherence and tolerance to his ART regimen of Biktarvy.  No signs/symptoms of opportunistic infection or progressive HIV disease.  Discussed meanings of undetectable, lab work, and plan of care.  Continue current dose of Biktarvy.  Plan for follow-up in 3 months or sooner if needed with lab work 1 to 2 weeks prior to appointment.

## 2019-09-27 NOTE — Assessment & Plan Note (Signed)
   Declines vaccinations today.  Due for Prevnar at next office visit.  Discussed importance of safe sexual practice to reduce risk of STI.  Condoms declined.

## 2019-09-27 NOTE — Assessment & Plan Note (Signed)
Shawn Cooke appears down today although denies feelings of being down, depressed, or hopeless.  Information on counseling provided in after visit summary.  He has no suicidal ideations and no signs/symptoms of psychosis.  Continue to monitor.

## 2019-09-27 NOTE — Progress Notes (Signed)
Subjective:    Patient ID: Shawn Cooke, male    DOB: 1995-12-25, 23 y.o.   MRN: [id number]  Chief Complaint  Patient presents with  . Follow-up    pt appears sad and irritable; offered counseling services, declined condoms; no complaints about biktarvy     HPI:  Shawn Cooke is a 23 y.o. male with HIV disease who was last seen in the office on 08/03/2019 with good adherence and tolerance to his ART regimen of Biktarvy.  Viral load had improved at the time from 232,000 down to 198.  CD4 count was 849.  Most recent blood work completed on 09/14/2019 with viral load that remains undetectable and CD4 count of 720.  Influenza vaccination up-to-date per recommendations.  Due for Prevnar vaccination today.  Shawn Cooke has been taking his Biktarvy as prescribed with no adverse side effects or missed doses since his last office visit.  Overall feeling well today with some fatigue secondary to increased levels of working multiple jobs. Denies fevers, chills, night sweats, headaches, changes in vision, neck pain/stiffness, nausea, diarrhea, vomiting, lesions or rashes.  Shawn Cooke has no problems obtaining his medication from the pharmacy.  Denies feelings of being down, depressed, or hopeless.  He has been feeling slightly more stressed recently.  Counseling discussed.  He has quit tobacco for several months now and restarted smoking the last several weeks and is eager to stop quitting smoking again.  He does drink alcohol on occasion.  No recreational or illicit drug use.  Not currently sexually active and declines condoms.   No Known Allergies    Outpatient Medications Prior to Visit  Medication Sig Dispense Refill  . bictegravir-emtricitabine-tenofovir AF (BIKTARVY) 50-200-25 MG TABS tablet Take 1 tablet by mouth daily. 30 tablet 2   No facility-administered medications prior to visit.      Past Medical History:  Diagnosis Date  . HIV infection (HCC)   . Influenza       Past Surgical History:  Procedure Laterality Date  . NO PAST SURGERIES       Review of Systems  Constitutional: Negative for appetite change, chills, fatigue, fever and unexpected weight change.  Eyes: Negative for visual disturbance.  Respiratory: Negative for cough, chest tightness, shortness of breath and wheezing.   Cardiovascular: Negative for chest pain and leg swelling.  Gastrointestinal: Negative for abdominal pain, constipation, diarrhea, nausea and vomiting.  Genitourinary: Negative for dysuria, flank pain, frequency, genital sores, hematuria and urgency.  Skin: Negative for rash.  Allergic/Immunologic: Negative for immunocompromised state.  Neurological: Negative for dizziness and headaches.      Objective:    BP 129/85   Pulse 65   Temp 98.4 F (36.9 C) (Oral)   Wt 148 lb (67.1 kg)   BMI 22.50 kg/m  Nursing note and vital signs reviewed.  Physical Exam Constitutional:      General: He is not in acute distress.    Appearance: He is well-developed.  Eyes:     Conjunctiva/sclera: Conjunctivae normal.  Neck:     Musculoskeletal: Neck supple.  Cardiovascular:     Rate and Rhythm: Normal rate and regular rhythm.     Heart sounds: Normal heart sounds. No murmur. No friction rub. No gallop.   Pulmonary:     Effort: Pulmonary effort is normal. No respiratory distress.     Breath sounds: Normal breath sounds. No wheezing or rales.  Chest:     Chest wall: No tenderness.  Abdominal:  General: Bowel sounds are normal.     Palpations: Abdomen is soft.     Tenderness: There is no abdominal tenderness.  Lymphadenopathy:     Cervical: No cervical adenopathy.  Skin:    General: Skin is warm and dry.     Findings: No rash.  Neurological:     Mental Status: He is alert and oriented to person, place, and time.  Psychiatric:        Behavior: Behavior normal.        Thought Content: Thought content normal.        Judgment: Judgment normal.      Depression  screen [facility] 2/9 09/27/2019 08/03/2019 07/03/2019 09/29/2017 07/27/2017  Decreased Interest 0 1 1 0 0  Down, Depressed, Hopeless 1 1 1  0 0  PHQ - 2 Score 1 2 2  0 0  Altered sleeping - - 1 - -  Tired, decreased energy - - 1 - -  Change in appetite - - 1 - -  Feeling bad or failure about yourself  - - 1 - -  Trouble concentrating - - 1 - -  Moving slowly or fidgety/restless - - 1 - -  PHQ-9 Score - - 8 - -       Assessment & Plan:    Patient Active Problem List   Diagnosis Date Noted  . Healthcare maintenance 08/03/2019  . HIV disease ([place]) 07/03/2019  . Tobacco use 07/03/2019  . Anxiety-like symptoms 09/29/2017  . Major depressive disorder, single episode, severe ([place]) 11/05/2016     Problem List Items Addressed This Visit      Other   Major depressive disorder, single episode, severe [facility])    Shawn Cooke appears down today although denies feelings of being down, depressed, or hopeless.  Information on counseling provided in after visit summary.  He has no suicidal ideations and no signs/symptoms of psychosis.  Continue to monitor.      HIV disease [facility]    Shawn Cooke has well-controlled HIV disease with good adherence and tolerance to his ART regimen of Biktarvy.  No signs/symptoms of opportunistic infection or progressive HIV disease.  Discussed meanings of undetectable, lab work, and plan of care.  Continue current dose of Biktarvy.  Plan for follow-up in 3 months or sooner if needed with lab work 1 to 2 weeks prior to appointment.      Relevant Medications   bictegravir-emtricitabine-tenofovir AF (BIKTARVY) 50-200-25 MG TABS tablet   Healthcare maintenance     Declines vaccinations today.  Due for Prevnar at next office visit.  Discussed importance of safe sexual practice to reduce risk of STI.  Condoms declined.         I am having Shawn Cooke maintain his bictegravir-emtricitabine-tenofovir AF.   Meds ordered this encounter  Medications  .  bictegravir-emtricitabine-tenofovir AF (BIKTARVY) 50-200-25 MG TABS tablet    Sig: Take 1 tablet by mouth daily.    Dispense:  30 tablet    Refill:  2    Order Specific Question:   Supervising Provider    Answer:   Carlyle Basques [[id number]]     Follow-up: Return in about 3 months (around 12/28/2019), or if symptoms worsen or fail to improve.   Terri Piedra, MSN, FNP-C Nurse Practitioner [facility] for Infectious Disease [place] number: [phone]

## 2019-10-16 ENCOUNTER — Other Ambulatory Visit: Payer: Self-pay

## 2019-10-16 ENCOUNTER — Telehealth: Payer: Self-pay | Admitting: *Deleted

## 2019-10-16 NOTE — Telephone Encounter (Signed)
RN spoke with patient. He states that he has had 1 recent sexual encounter, has had this rash since then. He states he used alcohol on it to prevent infection, but it made it worse. RN advised patient to avoid alcohol, as it could cause more damage. Patient advised to call [facility] for STI testing appointment. He agreed. Landis Gandy, RN     Appointment Request From: Shawn Cooke  With Provider: Mauricio Po, FNP [facility] for Infectious Disease]  Preferred Date Range: 10/16/2019 - 10/17/2019  Preferred Times: Any Time  Reason for visit: Office Visit  Comments: Penis Swelling/Rash

## 2019-10-17 ENCOUNTER — Ambulatory Visit: Payer: BLUE CROSS/BLUE SHIELD | Admitting: Family Medicine

## 2019-10-19 ENCOUNTER — Encounter: Payer: Self-pay | Admitting: Family Medicine

## 2019-10-21 ENCOUNTER — Telehealth: Payer: Self-pay | Admitting: Physician Assistant

## 2019-10-21 DIAGNOSIS — Z202 Contact with and (suspected) exposure to infections with a predominantly sexual mode of transmission: Secondary | ICD-10-CM

## 2019-10-21 DIAGNOSIS — R21 Rash and other nonspecific skin eruption: Secondary | ICD-10-CM

## 2019-10-21 NOTE — Progress Notes (Signed)
I have spent 5 minutes in review of e-visit questionnaire, review and updating patient chart, medical decision making and response to patient.   Juli Odom Cody Jerred Zaremba, PA-C    

## 2019-10-21 NOTE — Progress Notes (Signed)
Based on what you shared with me, I feel your condition warrants further evaluation and I recommend that you be seen for a face to face visit.    Based on the picture my concern for be for a candidal balanitis (yeast infection of the penis), irritation from masturbation with potential reaction to something applied to the area. I would avoid any sexual activity including masturbation until you can get a good examination. Although this area from picture does not see like an STI (but hard to tell from pictures) you also need urine testing to rule out any STDs since that is a potential concern.  Please contact your primary care physician practice to be seen. Many offices offer virtual options to be seen via video if you are not comfortable going in person to a medical facility at this time.  If you do not have a PCP, [facility] offers a free physician referral service available at [phone]. Our trained staff has the experience, knowledge and resources to put you in touch with a physician who is right for you.   You also have the option of a video visit through [email or web address]  If you are having a true medical emergency please call 911.  NOTE: If you entered your credit card information for this eVisit, you will not be charged. You may see a "hold" on your card for the $35 but that hold will drop off and you will not have a charge processed.  Your e-visit answers were reviewed by a board certified advanced clinical practitioner to complete your personal care plan.  Thank you for using e-Visits.

## 2019-10-22 DIAGNOSIS — R21 Rash and other nonspecific skin eruption: Secondary | ICD-10-CM | POA: Diagnosis not present

## 2019-10-23 MED FILL — BIKTARVY 50-200-25 MG TABS: 50-200-25 | 30 days supply | Qty: 30 | Fill #2

## 2019-11-24 MED FILL — BIKTARVY 50-200-25 MG TABS: 50-200-25 | 30 days supply | Qty: 30 | Fill #0

## 2019-12-25 MED FILL — BIKTARVY 50-200-25 MG TABS: 50-200-25 | 30 days supply | Qty: 30 | Fill #1

## 2020-01-05 ENCOUNTER — Telehealth: Payer: Self-pay

## 2020-01-05 NOTE — Telephone Encounter (Signed)
COVID-19 Pre-Screening Questions:01/05/20  Do you currently have a fever (>100 F), chills or unexplained body aches?NO  Are you currently experiencing new cough, shortness of breath, sore throat, runny nose? NO  .  Have you recently travelled outside the state of [place] in the last 14 days?NO .  Have you been in contact with someone that is currently pending confirmation of Covid19 testing or has been confirmed to have the Covid19 virus? NO  **If the patient answers NO to ALL questions -  advise the patient to please call the clinic before coming to the office should any symptoms develop.     

## 2020-01-08 ENCOUNTER — Other Ambulatory Visit: Payer: Self-pay | Admitting: Family

## 2020-01-08 ENCOUNTER — Other Ambulatory Visit: Payer: BC Managed Care – PPO

## 2020-01-08 ENCOUNTER — Other Ambulatory Visit: Payer: Self-pay

## 2020-01-08 ENCOUNTER — Ambulatory Visit: Payer: Self-pay

## 2020-01-08 DIAGNOSIS — B20 Human immunodeficiency virus [HIV] disease: Secondary | ICD-10-CM | POA: Diagnosis not present

## 2020-01-08 DIAGNOSIS — Z113 Encounter for screening for infections with a predominantly sexual mode of transmission: Secondary | ICD-10-CM

## 2020-01-09 LAB — T-HELPER CELL (CD4) - (RCID CLINIC ONLY)
CD4 % Helper T Cell: 35 % (ref 33–65)
CD4 T Cell Abs: 892 /uL (ref 400–1790)

## 2020-01-11 LAB — COMPREHENSIVE METABOLIC PANEL
AG Ratio: 1.6 (calc) (ref 1.0–2.5)
ALT: 21 U/L (ref 9–46)
AST: 19 U/L (ref 10–40)
Albumin: 4.3 g/dL (ref 3.6–5.1)
Alkaline phosphatase (APISO): 35 U/L — ABNORMAL LOW (ref 36–130)
BUN: 14 mg/dL (ref 7–25)
CO2: 30 mmol/L (ref 20–32)
Calcium: 9.5 mg/dL (ref 8.6–10.3)
Chloride: 104 mmol/L (ref 98–110)
Creat: 0.99 mg/dL (ref 0.60–1.35)
Globulin: 2.7 g/dL (calc) (ref 1.9–3.7)
Glucose, Bld: 111 mg/dL — ABNORMAL HIGH (ref 65–99)
Potassium: 4.7 mmol/L (ref 3.5–5.3)
Sodium: 139 mmol/L (ref 135–146)
Total Bilirubin: 0.6 mg/dL (ref 0.2–1.2)
Total Protein: 7 g/dL (ref 6.1–8.1)

## 2020-01-11 LAB — HIV-1 RNA QUANT-NO REFLEX-BLD
HIV 1 RNA Quant: 47 copies/mL — ABNORMAL HIGH
HIV-1 RNA Quant, Log: 1.67 Log copies/mL — ABNORMAL HIGH

## 2020-01-11 LAB — RPR: RPR Ser Ql: NONREACTIVE

## 2020-01-22 ENCOUNTER — Telehealth: Payer: Self-pay | Admitting: Pharmacy Technician

## 2020-01-22 ENCOUNTER — Other Ambulatory Visit: Payer: Self-pay | Admitting: Pharmacist

## 2020-01-22 DIAGNOSIS — B20 Human immunodeficiency virus [HIV] disease: Secondary | ICD-10-CM

## 2020-01-22 MED ORDER — BICTEGRAVIR-EMTRICITAB-TENOFOV 50-200-25 MG PO TABS: 1.0000 | ORAL_TABLET | Freq: Every day | ORAL | 2 refills | Status: DC

## 2020-01-22 NOTE — Progress Notes (Signed)
Patient's insurance requires Biktarvy to be filled at [facility]. Resending Rx now. Betty will call patient and coordinate.   

## 2020-01-22 NOTE — Telephone Encounter (Signed)
[facility] Patient Advocate Encounter  Left HIPPA compliant voicemail asking Mr. Sones to call.  He must use [facility] pharmacy going forward so that his copay is zero.  He can call [phone] to get his Biktarvy set up to ship to his home.  Netty Starring. Dimas Aguas CPhT Specialty Pharmacy Patient [facility] for Infectious Disease Phone: [phone] Fax:  [phone]

## 2020-01-23 ENCOUNTER — Encounter: Payer: Self-pay | Admitting: Family

## 2020-01-31 ENCOUNTER — Encounter: Payer: Self-pay | Admitting: Family

## 2020-01-31 ENCOUNTER — Other Ambulatory Visit: Payer: Self-pay

## 2020-01-31 ENCOUNTER — Ambulatory Visit: Payer: BC Managed Care – PPO | Admitting: Family

## 2020-01-31 VITALS — BP 112/70 | HR 75 | Temp 98.9°F | Ht 67.0 in | Wt 163.0 lb

## 2020-01-31 DIAGNOSIS — B20 Human immunodeficiency virus [HIV] disease: Secondary | ICD-10-CM | POA: Diagnosis not present

## 2020-01-31 DIAGNOSIS — Z0000 Encounter for general adult medical examination without abnormal findings: Secondary | ICD-10-CM

## 2020-01-31 DIAGNOSIS — F322 Major depressive disorder, single episode, severe without psychotic features: Secondary | ICD-10-CM | POA: Diagnosis not present

## 2020-01-31 DIAGNOSIS — Z113 Encounter for screening for infections with a predominantly sexual mode of transmission: Secondary | ICD-10-CM

## 2020-01-31 DIAGNOSIS — Z720 Tobacco use: Secondary | ICD-10-CM

## 2020-01-31 NOTE — Assessment & Plan Note (Signed)
Currently asymptomatic and feeling well with no recent feelings of being down, depressed, or hopeless.  Continue to monitor.

## 2020-01-31 NOTE — Assessment & Plan Note (Signed)
Currently smoking approximately 1 cigarette/day on average.  Encouraged to quit smoking to reduce risk of cardiovascular, respiratory, and malignant disease in the future.  He is in the precontemplation stage of quitting.  Continue to monitor.

## 2020-01-31 NOTE — Assessment & Plan Note (Signed)
Mr. Rollo continues to have well-controlled HIV disease with good adherence and tolerance to his ART regimen of Biktarvy.  No signs/symptoms of opportunistic infection or progressive HIV disease.  We reviewed his lab work and discussed the plan of care.  Introduced long-acting injectable medications which she will think about.  In the interim we will continue his current dose of Biktarvy.  Plan for follow-up in 4 months or sooner if needed with lab work 1 to 2 weeks prior to appointment.

## 2020-01-31 NOTE — Assessment & Plan Note (Addendum)
   Declines vaccinations today.  Due for Prevnar and tetanus at next office visit  Encouraged to complete dental visit/screening independently as he has dental insurance.  Discussed importance of safe sexual practice to reduce risk of STI.  Condoms declined.

## 2020-01-31 NOTE — Patient Instructions (Signed)
Nice to see you.   We will continue your Biktarvy daily.  Refills will be sent to the pharmacy.   Plan for follow up office visit in 4 months or sooner if needed.   Have a great day and stay safe!

## 2020-01-31 NOTE — Progress Notes (Signed)
Subjective:    Patient ID: Shawn Cooke, male    DOB: 1996-07-19, 24 y.o.   MRN: [id number]  Chief Complaint  Patient presents with  . HIV Positive/AIDS     HPI:  Shawn Cooke is a 24 y.o. male with HIV disease who was last seen in the office on 09/27/2019 with well-controlled HIV disease with good adherence and tolerance to his ART regimen of Biktarvy.  Blood work at the time showed a viral load that was undetectable with CD4 count of 720.  Most recent blood work completed on 01/08/2020 with a viral load of 47/undetectable and CD4 count of 892.  RPR was nonreactive for syphilis.  Kidney function, liver function, electrolytes within normal ranges.  Healthcare maintenance due includes tetanus vaccination and Prevnar.  Shawn Cooke continues to take his Biktarvy as prescribed with no adverse side effects or missed doses. Overall feeling well today with no new concerns complaints. Denies fevers, chills, night sweats, headaches, changes in vision, neck pain/stiffness, nausea, diarrhea, vomiting, lesions or rashes.  Shawn Cooke has no problems obtaining his medications from the pharmacy with change of insurance now receiving medication from [place].  Denies feelings of being down, depressed, or hopeless recently.  No recreational or illicit drug use or alcohol consumption.  He does continue to smoke about 1 cigarette/day on average.  He is sexually active and declines condoms today.  He has a promotion at work and continues to work at [employer] full-time.  He is due for a dental cleaning/screening.  No Known Allergies    Outpatient Medications Prior to Visit  Medication Sig Dispense Refill  . bictegravir-emtricitabine-tenofovir AF (BIKTARVY) 50-200-25 MG TABS tablet Take 1 tablet by mouth daily. 30 tablet 2   No facility-administered medications prior to visit.     Past Medical History:  Diagnosis Date  . HIV infection ([place])   . Influenza      Past Surgical  History:  Procedure Laterality Date  . NO PAST SURGERIES         Review of Systems  Constitutional: Negative for appetite change, chills, fatigue, fever and unexpected weight change.  Eyes: Negative for visual disturbance.  Respiratory: Negative for cough, chest tightness, shortness of breath and wheezing.   Cardiovascular: Negative for chest pain and leg swelling.  Gastrointestinal: Negative for abdominal pain, constipation, diarrhea, nausea and vomiting.  Genitourinary: Negative for dysuria, flank pain, frequency, genital sores, hematuria and urgency.  Skin: Negative for rash.  Allergic/Immunologic: Negative for immunocompromised state.  Neurological: Negative for dizziness and headaches.      Objective:    BP 112/70   Pulse 75   Temp 98.9 F (37.2 C)   Ht 5\' 7"  (1.702 m)   Wt 163 lb (73.9 kg)   SpO2 100%   BMI 25.53 kg/m  Nursing note and vital signs reviewed.  Physical Exam Constitutional:      General: He is not in acute distress.    Appearance: He is well-developed.  Cardiovascular:     Rate and Rhythm: Normal rate and regular rhythm.     Heart sounds: Normal heart sounds.  Pulmonary:     Effort: Pulmonary effort is normal.     Breath sounds: Normal breath sounds.  Skin:    General: Skin is warm and dry.  Neurological:     Mental Status: He is alert and oriented to person, place, and time.  Psychiatric:        Behavior: Behavior normal.  Thought Content: Thought content normal.        Judgment: Judgment normal.      Depression screen [facility] 2/9 09/27/2019 08/03/2019 07/03/2019 09/29/2017 07/27/2017  Decreased Interest 0 1 1 0 0  Down, Depressed, Hopeless 1 1 1  0 0  PHQ - 2 Score 1 2 2  0 0  Altered sleeping - - 1 - -  Tired, decreased energy - - 1 - -  Change in appetite - - 1 - -  Feeling bad or failure about yourself  - - 1 - -  Trouble concentrating - - 1 - -  Moving slowly or fidgety/restless - - 1 - -  PHQ-9 Score - - 8 - -         Assessment & Plan:    Patient Active Problem List   Diagnosis Date Noted  . Healthcare maintenance 08/03/2019  . HIV disease (HCC) 07/03/2019  . Tobacco use 07/03/2019  . Anxiety-like symptoms 09/29/2017  . Major depressive disorder, single episode, severe (HCC) 11/05/2016     Problem List Items Addressed This Visit      Other   Major depressive disorder, single episode, severe (HCC)    Currently asymptomatic and feeling well with no recent feelings of being down, depressed, or hopeless.  Continue to monitor.      HIV disease [facility]) - Primary    Shawn Cooke continues to have well-controlled HIV disease with good adherence and tolerance to his ART regimen of Biktarvy.  No signs/symptoms of opportunistic infection or progressive HIV disease.  We reviewed his lab work and discussed the plan of care.  Introduced long-acting injectable medications which she will think about.  In the interim we will continue his current dose of Biktarvy.  Plan for follow-up in 4 months or sooner if needed with lab work 1 to 2 weeks prior to appointment.      Tobacco use    Currently smoking approximately 1 cigarette/day on average.  Encouraged to quit smoking to reduce risk of cardiovascular, respiratory, and malignant disease in the future.  He is in the precontemplation stage of quitting.  Continue to monitor.      Healthcare maintenance     Declines vaccinations today.  Due for Prevnar and tetanus at next office visit  Encouraged to complete dental visit/screening independently as he has dental insurance.  Discussed importance of safe sexual practice to reduce risk of STI.  Condoms declined.          I am having [facility] maintain his bictegravir-emtricitabine-tenofovir AF.   No orders of the defined types were placed in this encounter.    Follow-up: Return in about 4 months (around 06/01/2020).   [name], MSN, FNP-C Nurse Practitioner [facility] for Infectious  Disease [facility] Main number: [phone]

## 2020-04-29 ENCOUNTER — Encounter: Payer: Self-pay | Admitting: Pharmacist

## 2020-05-10 ENCOUNTER — Other Ambulatory Visit: Payer: BC Managed Care – PPO

## 2020-05-17 DIAGNOSIS — J988 Other specified respiratory disorders: Secondary | ICD-10-CM | POA: Diagnosis not present

## 2020-05-17 DIAGNOSIS — R05 Cough: Secondary | ICD-10-CM | POA: Diagnosis not present

## 2020-05-17 DIAGNOSIS — Z20822 Contact with and (suspected) exposure to covid-19: Secondary | ICD-10-CM | POA: Diagnosis not present

## 2020-05-17 DIAGNOSIS — Z6823 Body mass index (BMI) 23.0-23.9, adult: Secondary | ICD-10-CM | POA: Diagnosis not present

## 2020-05-24 ENCOUNTER — Encounter: Payer: BC Managed Care – PPO | Admitting: Family

## 2020-07-16 ENCOUNTER — Telehealth: Payer: Self-pay

## 2020-07-16 NOTE — Telephone Encounter (Signed)
Received call today from patient stating he would like Biktarvy samples. States that there is a delay in receiving his medication. Took last dose yesterday.  Will forward message to FNP to advise on samples. Patient states pharmacy sends 3 months worth of medication. Denies missing any doses. Last dose was yesterday. Is scheduled to for labs on 9/16 Shawn Cooke, [place]

## 2020-07-17 NOTE — Telephone Encounter (Signed)
Ok for samples?

## 2020-07-18 ENCOUNTER — Ambulatory Visit: Payer: BC Managed Care – PPO

## 2020-07-18 ENCOUNTER — Other Ambulatory Visit: Payer: BC Managed Care – PPO

## 2020-07-18 ENCOUNTER — Encounter: Payer: Self-pay | Admitting: Family

## 2020-07-18 ENCOUNTER — Other Ambulatory Visit: Payer: Self-pay

## 2020-07-18 DIAGNOSIS — Z113 Encounter for screening for infections with a predominantly sexual mode of transmission: Secondary | ICD-10-CM

## 2020-07-18 DIAGNOSIS — B20 Human immunodeficiency virus [HIV] disease: Secondary | ICD-10-CM | POA: Diagnosis not present

## 2020-07-19 LAB — T-HELPER CELL (CD4) - (RCID CLINIC ONLY)
CD4 % Helper T Cell: 37 % (ref 33–65)
CD4 T Cell Abs: 1009 /uL (ref 400–1790)

## 2020-07-22 LAB — COMPREHENSIVE METABOLIC PANEL
AG Ratio: 1.8 (calc) (ref 1.0–2.5)
ALT: 14 U/L (ref 9–46)
AST: 14 U/L (ref 10–40)
Albumin: 4.7 g/dL (ref 3.6–5.1)
Alkaline phosphatase (APISO): 38 U/L (ref 36–130)
BUN: 11 mg/dL (ref 7–25)
CO2: 31 mmol/L (ref 20–32)
Calcium: 9.7 mg/dL (ref 8.6–10.3)
Chloride: 102 mmol/L (ref 98–110)
Creat: 0.99 mg/dL (ref 0.60–1.35)
Globulin: 2.6 g/dL (calc) (ref 1.9–3.7)
Glucose, Bld: 86 mg/dL (ref 65–99)
Potassium: 4.4 mmol/L (ref 3.5–5.3)
Sodium: 140 mmol/L (ref 135–146)
Total Bilirubin: 0.6 mg/dL (ref 0.2–1.2)
Total Protein: 7.3 g/dL (ref 6.1–8.1)

## 2020-07-22 LAB — HIV-1 RNA QUANT-NO REFLEX-BLD
HIV 1 RNA Quant: <20 Copies/mL
HIV-1 RNA Quant, Log: <1.3 Log cps/mL

## 2020-07-22 LAB — RPR: RPR Ser Ql: NONREACTIVE

## 2020-08-08 ENCOUNTER — Encounter: Payer: BC Managed Care – PPO | Admitting: Family

## 2020-08-22 DIAGNOSIS — K529 Noninfective gastroenteritis and colitis, unspecified: Secondary | ICD-10-CM | POA: Diagnosis not present

## 2020-08-22 DIAGNOSIS — R112 Nausea with vomiting, unspecified: Secondary | ICD-10-CM | POA: Diagnosis not present

## 2020-08-22 DIAGNOSIS — R109 Unspecified abdominal pain: Secondary | ICD-10-CM | POA: Diagnosis not present

## 2020-08-22 DIAGNOSIS — B20 Human immunodeficiency virus [HIV] disease: Secondary | ICD-10-CM | POA: Diagnosis not present

## 2020-08-22 DIAGNOSIS — R059 Cough, unspecified: Secondary | ICD-10-CM | POA: Diagnosis not present

## 2020-08-22 DIAGNOSIS — R1110 Vomiting, unspecified: Secondary | ICD-10-CM | POA: Diagnosis not present

## 2020-08-22 DIAGNOSIS — Z20822 Contact with and (suspected) exposure to covid-19: Secondary | ICD-10-CM | POA: Diagnosis not present

## 2020-08-22 DIAGNOSIS — F17210 Nicotine dependence, cigarettes, uncomplicated: Secondary | ICD-10-CM | POA: Diagnosis not present

## 2020-09-07 ENCOUNTER — Other Ambulatory Visit: Payer: Self-pay | Admitting: Pharmacist

## 2020-09-07 DIAGNOSIS — B20 Human immunodeficiency virus [HIV] disease: Secondary | ICD-10-CM

## 2020-09-19 ENCOUNTER — Other Ambulatory Visit: Payer: Self-pay | Admitting: Family

## 2020-09-19 DIAGNOSIS — B20 Human immunodeficiency virus [HIV] disease: Secondary | ICD-10-CM

## 2020-11-04 ENCOUNTER — Other Ambulatory Visit: Payer: Self-pay | Admitting: Family

## 2020-11-04 ENCOUNTER — Other Ambulatory Visit: Payer: Self-pay | Admitting: Pharmacist

## 2020-11-04 DIAGNOSIS — B20 Human immunodeficiency virus [HIV] disease: Secondary | ICD-10-CM

## 2020-11-04 NOTE — Telephone Encounter (Signed)
Greg's paitent

## 2020-11-07 ENCOUNTER — Other Ambulatory Visit: Payer: BC Managed Care – PPO

## 2020-11-07 ENCOUNTER — Other Ambulatory Visit: Payer: Self-pay

## 2020-11-07 ENCOUNTER — Encounter: Payer: Self-pay | Admitting: Pharmacist

## 2020-11-07 ENCOUNTER — Other Ambulatory Visit (HOSPITAL_COMMUNITY)
Admission: RE | Admit: 2020-11-07 | Discharge: 2020-11-07 | Disposition: A | Discharge status: Home / Self Care | Payer: BC Managed Care – PPO | Source: Ambulatory Visit | Attending: Family | Admitting: Family

## 2020-11-07 ENCOUNTER — Telehealth: Payer: Self-pay

## 2020-11-07 DIAGNOSIS — Z113 Encounter for screening for infections with a predominantly sexual mode of transmission: Secondary | ICD-10-CM | POA: Diagnosis not present

## 2020-11-07 DIAGNOSIS — Z79899 Other long term (current) drug therapy: Secondary | ICD-10-CM

## 2020-11-07 DIAGNOSIS — B20 Human immunodeficiency virus [HIV] disease: Secondary | ICD-10-CM

## 2020-11-07 MED ORDER — BIKTARVY 50-200-25 MG PO TABS: 1.0000 | ORAL_TABLET | Freq: Every day | ORAL | 0 refills | Status: DC

## 2020-11-07 NOTE — Progress Notes (Signed)
Medication Samples have been provided to the patient.  Drug name: Biktarvy        Strength: 50/200/25 mg       Qty: 7 tablets; 1 bottle   LOT: [id number] Exp.Date: 12/22/22  Dosing instructions: Take one tablet by mouth once daily  The patient has been instructed regarding the correct time, dose, and frequency of taking this medication, including desired effects and most common side effects.   Keirra Zeimet L. Jannette Fogo, PharmD, BCIDP, AAHIVP, CPP Clinical Pharmacist Practitioner Infectious Diseases Clinical Pharmacist [facility] 10/14/2020, 10:07 AM

## 2020-11-07 NOTE — Telephone Encounter (Signed)
Patient here as a walk-in, states he's been out of Biktarvy for 1 week. Says it's hard to come in for appointments with his new job. RN advised patient we can do some visits in person and others over video/phone for convenience. Also advised patient that he can call us when he is getting low on medication to avoid lapses.   RN spoke with pharmacy and provided patient 7 days worth of Biktarvy samples. Will send in 1 month supply refill to [facility]. Advised patient he must answer the phone when his pharmacy calls in order to continue with mail delivery. Patient verbalized understanding and has no further questions.   Sandie Ano, RN

## 2020-11-08 LAB — URINE CYTOLOGY ANCILLARY ONLY
Chlamydia: NEGATIVE
Comment: NEGATIVE
Comment: NORMAL
Neisseria Gonorrhea: NEGATIVE

## 2020-11-08 LAB — T-HELPER CELL (CD4) - (RCID CLINIC ONLY)
CD4 % Helper T Cell: 32 % — ABNORMAL LOW (ref 33–65)
CD4 T Cell Abs: 1082 /uL (ref 400–1790)

## 2020-11-13 ENCOUNTER — Telehealth: Payer: BC Managed Care – PPO | Admitting: Family

## 2020-11-13 DIAGNOSIS — U071 COVID-19: Secondary | ICD-10-CM

## 2020-11-13 DIAGNOSIS — R0602 Shortness of breath: Secondary | ICD-10-CM

## 2020-11-13 NOTE — Progress Notes (Signed)
Based on what you shared with me, I feel your condition warrants further evaluation and I recommend that you be seen for a face to face office visit.  Given your SOB you need to be seen face to face today. I recommend going to the Urgent Care.    NOTE: If you entered your credit card information for this eVisit, you will not be charged. You may see a "hold" on your card for the $35 but that hold will drop off and you will not have a charge processed.   If you are having a true medical emergency please call 911.      For an urgent face to face visit, [place] has five urgent care centers for your convenience:     [facility] at [facility] Directions [phone] [address] [place], [place] [address] . 10 am - 6pm Monday - Friday    [facility] Urgent Care Center [facility]) Get Driving Directions [phone] [address] [place], [place] [address] . 10 am to 8 pm Monday-Friday . 12 pm to 8 pm [facility] Urgent Care at [facility] Get Driving Directions [phone] [address], [address] [place], [place] [address] . 8 am to 8 pm Monday-Friday . 9 am to 6 pm Saturday . 11 am to 6 pm Sunday     [facility] Urgent Care at [facility] Get Driving Directions  [phone] [address] [place], [place] [address] . 8 am to 8 pm Monday-Friday . 8 am to 4 pm [facility] Urgent Care at [facility] Directions [phone] [address] [place], [place] [address] . 12 pm to 6 pm Monday-Friday      Your e-visit answers were reviewed by a board certified advanced clinical practitioner to complete your personal care plan.  Thank you for using e-Visits.

## 2020-11-15 LAB — COMPLETE METABOLIC PANEL WITH GFR
AG Ratio: 1.5 (calc) (ref 1.0–2.5)
ALT: 10 U/L (ref 9–46)
AST: 12 U/L (ref 10–40)
Albumin: 4.4 g/dL (ref 3.6–5.1)
Alkaline phosphatase (APISO): 36 U/L (ref 36–130)
BUN: 14 mg/dL (ref 7–25)
CO2: 31 mmol/L (ref 20–32)
Calcium: 9.9 mg/dL (ref 8.6–10.3)
Chloride: 104 mmol/L (ref 98–110)
Creat: 1.03 mg/dL (ref 0.60–1.35)
GFR, Est African American: 117 mL/min/{1.73_m2} (ref >=60)
GFR, Est Non African American: 101 mL/min/{1.73_m2} (ref >=60)
Globulin: 2.9 g/dL (calc) (ref 1.9–3.7)
Glucose, Bld: 81 mg/dL (ref 65–99)
Potassium: 4.4 mmol/L (ref 3.5–5.3)
Sodium: 141 mmol/L (ref 135–146)
Total Bilirubin: 0.9 mg/dL (ref 0.2–1.2)
Total Protein: 7.3 g/dL (ref 6.1–8.1)

## 2020-11-15 LAB — CBC WITH DIFFERENTIAL/PLATELET
Absolute Monocytes: 290 cells/uL (ref 200–950)
Basophils Absolute: 41 cells/uL (ref 0–200)
Basophils Relative: 0.7 %
Eosinophils Absolute: 110 cells/uL (ref 15–500)
Eosinophils Relative: 1.9 %
HCT: 42.1 % (ref 38.5–50.0)
Hemoglobin: 14.2 g/dL (ref 13.2–17.1)
Lymphs Abs: 3544 cells/uL (ref 850–3900)
MCH: 30.5 pg (ref 27.0–33.0)
MCHC: 33.7 g/dL (ref 32.0–36.0)
MCV: 90.3 fL (ref 80.0–100.0)
MPV: 10 fL (ref 7.5–12.5)
Monocytes Relative: 5 %
Neutro Abs: 1815 cells/uL (ref 1500–7800)
Neutrophils Relative %: 31.3 %
Platelets: 298 10*3/uL (ref 140–400)
RBC: 4.66 10*6/uL (ref 4.20–5.80)
RDW: 12.5 % (ref 11.0–15.0)
Total Lymphocyte: 61.1 %
WBC: 5.8 10*3/uL (ref 3.8–10.8)

## 2020-11-15 LAB — LIPID PANEL
Cholesterol: 88 mg/dL (ref <200)
HDL: 41 mg/dL (ref >=40)
LDL Cholesterol (Calc): 27 mg/dL (calc)
Non-HDL Cholesterol (Calc): 47 mg/dL (calc) (ref <130)
Total CHOL/HDL Ratio: 2.1 (calc) (ref <5.0)
Triglycerides: 116 mg/dL (ref <150)

## 2020-11-15 LAB — HIV-1 RNA QUANT-NO REFLEX-BLD
HIV 1 RNA Quant: <20 Copies/mL — ABNORMAL HIGH
HIV-1 RNA Quant, Log: <1.3 Log cps/mL — ABNORMAL HIGH

## 2020-11-28 ENCOUNTER — Other Ambulatory Visit: Payer: Self-pay

## 2020-11-28 ENCOUNTER — Other Ambulatory Visit (HOSPITAL_COMMUNITY)
Admission: RE | Admit: 2020-11-28 | Discharge: 2020-11-28 | Disposition: A | Discharge status: Home / Self Care | Payer: BC Managed Care – PPO | Source: Ambulatory Visit | Attending: Family | Admitting: Family

## 2020-11-28 ENCOUNTER — Ambulatory Visit: Payer: BC Managed Care – PPO

## 2020-11-28 ENCOUNTER — Encounter: Payer: Self-pay | Admitting: Family

## 2020-11-28 ENCOUNTER — Ambulatory Visit (INDEPENDENT_AMBULATORY_CARE_PROVIDER_SITE_OTHER): Payer: BC Managed Care – PPO | Admitting: Family

## 2020-11-28 VITALS — BP 127/80 | HR 95 | Temp 98.0°F | Wt 150.0 lb

## 2020-11-28 DIAGNOSIS — Z113 Encounter for screening for infections with a predominantly sexual mode of transmission: Secondary | ICD-10-CM | POA: Insufficient documentation

## 2020-11-28 DIAGNOSIS — Z0000 Encounter for general adult medical examination without abnormal findings: Secondary | ICD-10-CM | POA: Diagnosis not present

## 2020-11-28 DIAGNOSIS — B20 Human immunodeficiency virus [HIV] disease: Secondary | ICD-10-CM | POA: Diagnosis not present

## 2020-11-28 MED ORDER — BIKTARVY 50-200-25 MG PO TABS: 1.0000 | ORAL_TABLET | Freq: Every day | ORAL | 3 refills | Status: DC

## 2020-11-28 NOTE — Assessment & Plan Note (Signed)
   Discussed importance of safe sexual practice to reduce risk of STI.  Condoms provided.  Due for routine dental care encouraged to be completed independently with commercial insurance.  Declines Covid vaccine.

## 2020-11-28 NOTE — Patient Instructions (Signed)
Nice to see you.  We will check your urine today.   Restart taking your Biktarvy daily.   Please let us know if the copay card does not help - let our pharmacy staff know if you need assistance.   Plan for follow up in 3 months or sooner if needed with lab work 1-2 weeks prior to appointment.   Have a great day and stay safe!

## 2020-11-28 NOTE — Progress Notes (Signed)
Subjective:    Patient ID: Shawn Cooke, male    DOB: 12-20-1995, 25 y.o.   MRN: [id number]  Chief Complaint  Patient presents with  . Follow-up    B20. Patient has not has Biktarvy in over a week due to high copay with his insurance.     HPI:  Shawn Cooke is a 25 y.o. male with HIV disease last seen on 01/31/2020 with well-controlled virus and good adherence and tolerance to his ART regimen of Biktarvy.  Lab work at the time showed a viral load that was undetectable and CD4 count of 892.  Most recent blood work completed on 11/07/2020 with viral load that remains undetectable and CD4 count 1082.  He has missed 2 appointments since his last office visit.  Was seen in the ED on 08/22/2020 with gastroenteritis.  Here today for routine follow-up.  Shawn Cooke has been without medication for about 1 week now secondary to cost of medication copay. Overall feeling well today with no new concerns/complaints. Denies fevers, chills, night sweats, headaches, changes in vision, neck pain/stiffness, nausea, diarrhea, vomiting, lesions or rashes.  Shawn Cooke needs a copay card to be able to continue to afford his medications. Denies feelings of being down, depressed or hopeless recently. Consumes alcohol and smokes marijuana on occasion while tobacco use is 1/4 pack per day. Declines Covid vaccines. Due for routine dental care. Did have recent partner test positive for gonorrhea and would like to recheck.    No Known Allergies    Outpatient Medications Prior to Visit  Medication Sig Dispense Refill  . BIKTARVY 50-200-25 MG TABS tablet Take 1 tablet by mouth daily. 30 tablet 0   No facility-administered medications prior to visit.     Past Medical History:  Diagnosis Date  . HIV infection (HCC)   . Influenza      Past Surgical History:  Procedure Laterality Date  . NO PAST SURGERIES      Review of Systems  Constitutional: Negative for appetite change, chills, fatigue,  fever and unexpected weight change.  Eyes: Negative for visual disturbance.  Respiratory: Negative for cough, chest tightness, shortness of breath and wheezing.   Cardiovascular: Negative for chest pain and leg swelling.  Gastrointestinal: Negative for abdominal pain, constipation, diarrhea, nausea and vomiting.  Genitourinary: Negative for dysuria, flank pain, frequency, genital sores, hematuria and urgency.  Skin: Negative for rash.  Allergic/Immunologic: Negative for immunocompromised state.  Neurological: Negative for dizziness and headaches.      Objective:    BP 127/80   Pulse 95   Temp 98 F (36.7 C) (Oral)   Wt 150 lb (68 kg)   BMI 23.49 kg/m  Nursing note and vital signs reviewed.  Physical Exam Constitutional:      General: He is not in acute distress.    Appearance: He is well-developed.  HENT:     Mouth/Throat:     Mouth: Oropharynx is clear and moist.  Eyes:     Conjunctiva/sclera: Conjunctivae normal.  Cardiovascular:     Rate and Rhythm: Normal rate and regular rhythm.     Pulses: Intact distal pulses.     Heart sounds: Normal heart sounds. No murmur heard. No friction rub. No gallop.   Pulmonary:     Effort: Pulmonary effort is normal. No respiratory distress.     Breath sounds: Normal breath sounds. No wheezing or rales.  Chest:     Chest wall: No tenderness.  Abdominal:     General:  Bowel sounds are normal.     Palpations: Abdomen is soft.     Tenderness: There is no abdominal tenderness.  Musculoskeletal:     Cervical back: Neck supple.  Lymphadenopathy:     Cervical: No cervical adenopathy.  Skin:    General: Skin is warm and dry.     Findings: No rash.  Neurological:     Mental Status: He is alert and oriented to person, place, and time.  Psychiatric:        Mood and Affect: Mood and affect normal.        Behavior: Behavior normal.        Thought Content: Thought content normal.        Judgment: Judgment normal.      Depression  screen [facility] 2/9 11/28/2020 09/27/2019 08/03/2019 07/03/2019 09/29/2017  Decreased Interest 0 0 1 1 0  Down, Depressed, Hopeless 2 1 1 1  0  PHQ - 2 Score 2 1 2 2  0  Altered sleeping 0 - - 1 -  Tired, decreased energy 0 - - 1 -  Change in appetite 0 - - 1 -  Feeling bad or failure about yourself  1 - - 1 -  Trouble concentrating 0 - - 1 -  Moving slowly or fidgety/restless 0 - - 1 -  Suicidal thoughts 0 - - - -  PHQ-9 Score 3 - - 8 -  Difficult doing work/chores Not difficult at all - - - -       Assessment & Plan:    Patient Active Problem List   Diagnosis Date Noted  . Healthcare maintenance 08/03/2019  . HIV disease (HCC) 07/03/2019  . Tobacco use 07/03/2019  . Anxiety-like symptoms 09/29/2017  . Major depressive disorder, single episode, severe (HCC) 11/05/2016     Problem List Items Addressed This Visit      Other   HIV disease [facility])    Shawn Cooke generally has well-controlled HIV disease with good adherence and tolerance to his ART regimen of Biktarvy.  However recently ran out of medication secondary to financial cost and not having a co-pay card.  Fortunately he has no signs/symptoms of opportunistic infection or progressive HIV disease.  We discussed the notifying pharmacy staff if his situation similar to this arises in the future.  Co-pay card provided.  Continue current dose of Biktarvy.  Plan for follow-up in 3 months or sooner if needed.      Relevant Medications   BIKTARVY 50-200-25 MG TABS tablet   Healthcare maintenance     Discussed importance of safe sexual practice to reduce risk of STI.  Condoms provided.  Due for routine dental care encouraged to be completed independently with commercial insurance.  Declines Covid vaccine.       Other Visit Diagnoses    Screening for STDs (sexually transmitted diseases)    -  Primary   Relevant Orders   Urine cytology ancillary only([place])       I am having [facility] maintain his  Biktarvy.   Meds ordered this encounter  Medications  . BIKTARVY 50-200-25 MG TABS tablet    Sig: Take 1 tablet by mouth daily.    Dispense:  30 tablet    Refill:  3    Order Specific Question:   Supervising Provider    Answer:   Laural Benes [[id number]]     Follow-up: Return in about 3 months (around 02/26/2021), or if symptoms worsen or fail to improve.   Judyann Munson  Elna Breslow, MSN, FNP-C Nurse Practitioner [facility] for [place] number: [phone]

## 2020-11-28 NOTE — Assessment & Plan Note (Signed)
Shawn Cooke generally has well-controlled HIV disease with good adherence and tolerance to his ART regimen of Biktarvy.  However recently ran out of medication secondary to financial cost and not having a co-pay card.  Fortunately he has no signs/symptoms of opportunistic infection or progressive HIV disease.  We discussed the notifying pharmacy staff if his situation similar to this arises in the future.  Co-pay card provided.  Continue current dose of Biktarvy.  Plan for follow-up in 3 months or sooner if needed.

## 2020-11-29 LAB — URINE CYTOLOGY ANCILLARY ONLY
Chlamydia: NEGATIVE
Comment: NEGATIVE
Comment: NORMAL
Neisseria Gonorrhea: NEGATIVE

## 2021-02-02 ENCOUNTER — Other Ambulatory Visit: Payer: Self-pay | Admitting: Family

## 2021-02-02 DIAGNOSIS — B20 Human immunodeficiency virus [HIV] disease: Secondary | ICD-10-CM

## 2021-02-12 ENCOUNTER — Other Ambulatory Visit: Payer: BC Managed Care – PPO

## 2021-02-26 ENCOUNTER — Encounter: Payer: BC Managed Care – PPO | Admitting: Family

## 2021-04-08 ENCOUNTER — Other Ambulatory Visit: Payer: Self-pay

## 2021-04-08 ENCOUNTER — Other Ambulatory Visit: Payer: BC Managed Care – PPO

## 2021-04-08 DIAGNOSIS — B20 Human immunodeficiency virus [HIV] disease: Secondary | ICD-10-CM

## 2021-04-08 DIAGNOSIS — Z113 Encounter for screening for infections with a predominantly sexual mode of transmission: Secondary | ICD-10-CM | POA: Diagnosis not present

## 2021-04-09 LAB — T-HELPER CELL (CD4) - (RCID CLINIC ONLY)
CD4 % Helper T Cell: 34 % (ref 33–65)
CD4 T Cell Abs: 715 /uL (ref 400–1790)

## 2021-04-10 LAB — COMPREHENSIVE METABOLIC PANEL
AG Ratio: 1.6 (calc) (ref 1.0–2.5)
ALT: 14 U/L (ref 9–46)
AST: 19 U/L (ref 10–40)
Albumin: 4.6 g/dL (ref 3.6–5.1)
Alkaline phosphatase (APISO): 35 U/L — ABNORMAL LOW (ref 36–130)
BUN: 13 mg/dL (ref 7–25)
CO2: 26 mmol/L (ref 20–32)
Calcium: 9.9 mg/dL (ref 8.6–10.3)
Chloride: 105 mmol/L (ref 98–110)
Creat: 1.05 mg/dL (ref 0.60–1.35)
Globulin: 2.8 g/dL (calc) (ref 1.9–3.7)
Glucose, Bld: 89 mg/dL (ref 65–99)
Potassium: 4.4 mmol/L (ref 3.5–5.3)
Sodium: 141 mmol/L (ref 135–146)
Total Bilirubin: 0.4 mg/dL (ref 0.2–1.2)
Total Protein: 7.4 g/dL (ref 6.1–8.1)

## 2021-04-10 LAB — HIV-1 RNA QUANT-NO REFLEX-BLD
HIV 1 RNA Quant: NOT DETECTED Copies/mL
HIV-1 RNA Quant, Log: NOT DETECTED Log cps/mL

## 2021-04-10 LAB — RPR: RPR Ser Ql: NONREACTIVE

## 2021-04-22 ENCOUNTER — Encounter: Payer: BC Managed Care – PPO | Admitting: Family

## 2021-06-12 ENCOUNTER — Ambulatory Visit (INDEPENDENT_AMBULATORY_CARE_PROVIDER_SITE_OTHER): Payer: BC Managed Care – PPO | Admitting: Family

## 2021-06-12 ENCOUNTER — Ambulatory Visit: Payer: BC Managed Care – PPO

## 2021-06-12 ENCOUNTER — Other Ambulatory Visit: Payer: Self-pay

## 2021-06-12 ENCOUNTER — Other Ambulatory Visit (HOSPITAL_COMMUNITY): Payer: Self-pay

## 2021-06-12 ENCOUNTER — Encounter: Payer: Self-pay | Admitting: Family

## 2021-06-12 DIAGNOSIS — Z0000 Encounter for general adult medical examination without abnormal findings: Secondary | ICD-10-CM | POA: Diagnosis not present

## 2021-06-12 DIAGNOSIS — B20 Human immunodeficiency virus [HIV] disease: Secondary | ICD-10-CM | POA: Diagnosis not present

## 2021-06-12 MED ORDER — BIKTARVY 50-200-25 MG PO TABS: 1.0000 | ORAL_TABLET | Freq: Every day | ORAL | 3 refills | Status: DC

## 2021-06-12 NOTE — Patient Instructions (Addendum)
Nice to see you.  Restart taking your medication.   Refills have been sent to the pharmacy.  Plan for follow up in 1 month or sooner if needed.  Have great day and great vacation!

## 2021-06-12 NOTE — Progress Notes (Signed)
Brief Narrative   Patient ID: Shawn Cooke, male    DOB: [birth year], 25 y.o.   MRN: [id number]    Subjective:    Chief Complaint  Patient presents with   Follow-up    B20 - pt reports has been off Biktarvy for 1 month.     HPI:  Shawn Cooke is a 25 y.o. male with HIV disease last seen on 11/28/2020 with well-controlled virus and good adherence and tolerance to his ART regimen of Biktarvy.  Viral load was undetectable with CD4 count of 1082.  Most recent lab work completed on 04/08/2021 with viral load that remains undetectable and CD4 count of 715.  Here today for routine follow-up.  Shawn Cooke has been out of medication for the last month secondary to life stressors and issues with his insurance.  This is since been resolved.  Having fatigue otherwise feeling well.  He is working 6 days/week and is set to take a vacation starting at the end of this week. Denies fevers, chills, night sweats, headaches, changes in vision, neck pain/stiffness, nausea, diarrhea, vomiting, lesions or rashes.  Shawn Cooke initially had problems obtaining medication from the pharmacy and now has a co-pay card after meeting with financial assistance.  Denies feelings of being down, depressed, or hopeless recently.  Drinks alcohol on occasion with no current recreational or illicit drug use and about 1/4 pack of cigarettes per day.  Immunizations and routine dental care up-to-date per recommendations.  Condoms offered and declined.  Problem with insurance and unable to get medication. Condoms; no drugs, some alcohol,    No Known Allergies    Outpatient Medications Prior to Visit  Medication Sig Dispense Refill   BIKTARVY 50-200-25 MG TABS tablet Take 1 tablet by mouth daily. (Patient not taking: Reported on 06/12/2021) 30 tablet 3   No facility-administered medications prior to visit.     Past Medical History:  Diagnosis Date   HIV infection (HCC)    Influenza      Past Surgical  History:  Procedure Laterality Date   NO PAST SURGERIES        Review of Systems  Constitutional:  Negative for appetite change, chills, fatigue, fever and unexpected weight change.  Eyes:  Negative for visual disturbance.  Respiratory:  Negative for cough, chest tightness, shortness of breath and wheezing.   Cardiovascular:  Negative for chest pain and leg swelling.  Gastrointestinal:  Negative for abdominal pain, constipation, diarrhea, nausea and vomiting.  Genitourinary:  Negative for dysuria, flank pain, frequency, genital sores, hematuria and urgency.  Skin:  Negative for rash.  Allergic/Immunologic: Negative for immunocompromised state.  Neurological:  Negative for dizziness and headaches.     Objective:    BP 122/78   Pulse 100   Temp 98.6 F (37 C) (Oral)   Resp 16   SpO2 96%  Nursing note and vital signs reviewed.  Physical Exam Constitutional:      General: He is not in acute distress.    Appearance: He is well-developed.  Eyes:     Conjunctiva/sclera: Conjunctivae normal.  Cardiovascular:     Rate and Rhythm: Normal rate and regular rhythm.     Heart sounds: Normal heart sounds. No murmur heard.   No friction rub. No gallop.  Pulmonary:     Effort: Pulmonary effort is normal. No respiratory distress.     Breath sounds: Normal breath sounds. No wheezing or rales.  Chest:     Chest wall: No tenderness.  Abdominal:     General: Bowel sounds are normal.     Palpations: Abdomen is soft.     Tenderness: There is no abdominal tenderness.  Musculoskeletal:     Cervical back: Neck supple.  Lymphadenopathy:     Cervical: No cervical adenopathy.  Skin:    General: Skin is warm and dry.     Findings: No rash.  Neurological:     Mental Status: He is alert and oriented to person, place, and time.  Psychiatric:        Behavior: Behavior normal.        Thought Content: Thought content normal.        Judgment: Judgment normal.     Depression screen Shawn Cooke 2/9  11/28/2020 09/27/2019 08/03/2019 07/03/2019 09/29/2017  Decreased Interest 0 0 1 1 0  Down, Depressed, Hopeless 2 1 1 1  0  PHQ - 2 Score 2 1 2 2  0  Altered sleeping 0 - - 1 -  Tired, decreased energy 0 - - 1 -  Change in appetite 0 - - 1 -  Feeling bad or failure about yourself  1 - - 1 -  Trouble concentrating 0 - - 1 -  Moving slowly or fidgety/restless 0 - - 1 -  Suicidal thoughts 0 - - - -  PHQ-9 Score 3 - - 8 -  Difficult doing work/chores Not difficult at all - - - -       Assessment & Plan:    Patient Active Problem List   Diagnosis Date Noted   Healthcare maintenance 08/03/2019   HIV disease (HCC) 07/03/2019   Tobacco use 07/03/2019   Anxiety-like symptoms 09/29/2017   Major depressive disorder, single episode, severe (HCC) 11/05/2016     Problem List Items Addressed This Visit       Other   HIV disease Shawn Cooke)    Shawn Cooke has less than optimal adherence to his ART regimen of Biktarvy secondary to insurance issues and obtaining medication which have since been resolved.  No signs/symptoms of opportunistic infection.  We discussed importance of notifying clinic when unable to obtain medications and to contact pharmacy staff or provider for assistance.  Most recent blood work indicates well-controlled virus.  Continue current dose of Biktarvy.  Plan for follow-up in 1 month or sooner if needed.      Relevant Medications   BIKTARVY 50-200-25 MG TABS tablet   Healthcare maintenance    Discussed importance of safe sexual practice to reduce risk of STI.  Condoms offered and declined. Routine dental care is up-to-date per recommendations. Vaccines up-to-date per recommendations.        I am having Shawn Cooke maintain his Biktarvy.   Meds ordered this encounter  Medications   BIKTARVY 50-200-25 MG TABS tablet    Sig: Take 1 tablet by mouth daily.    Dispense:  30 tablet    Refill:  3    Order Specific Question:   Supervising Provider    Answer:    Shawn Cooke [[id number]]     Follow-up: Return in about 1 month (around 07/13/2021), or if symptoms worsen or fail to improve.   Shawn Munson, MSN, FNP-C Nurse Practitioner [facility] for Infectious [facility] Main number: [phone]

## 2021-06-12 NOTE — Assessment & Plan Note (Signed)
Shawn Cooke has less than optimal adherence to his ART regimen of Biktarvy secondary to insurance issues and obtaining medication which have since been resolved.  No signs/symptoms of opportunistic infection.  We discussed importance of notifying clinic when unable to obtain medications and to contact pharmacy staff or provider for assistance.  Most recent blood work indicates well-controlled virus.  Continue current dose of Biktarvy.  Plan for follow-up in 1 month or sooner if needed.

## 2021-06-12 NOTE — Assessment & Plan Note (Signed)
   Discussed importance of safe sexual practice to reduce risk of STI.  Condoms offered and declined.  Routine dental care is up-to-date per recommendations.  Vaccines up-to-date per recommendations.

## 2021-06-13 ENCOUNTER — Other Ambulatory Visit: Payer: Self-pay | Admitting: Pharmacist

## 2021-06-13 DIAGNOSIS — B20 Human immunodeficiency virus [HIV] disease: Secondary | ICD-10-CM

## 2021-06-13 MED ORDER — BIKTARVY 50-200-25 MG PO TABS: 1.0000 | ORAL_TABLET | Freq: Every day | ORAL | 0 refills | Status: DC

## 2021-06-13 NOTE — Telephone Encounter (Signed)
Medication Samples have been provided to the patient.  Drug name: Biktarvy        Strength: 50/200/25 mg       Qty: 7 tablets (1 bottle)   LOT: [id number]   Exp.Date: 6/24  Dosing instructions: Take one tablet by mouth once daily  The patient has been instructed regarding the correct time, dose, and frequency of taking this medication, including desired effects and most common side effects.   Margarite Gouge, PharmD, CPP Clinical Pharmacist Practitioner Infectious Diseases Clinical Pharmacist [facility]  10/14/2020, 10:07 AM

## 2021-07-14 ENCOUNTER — Ambulatory Visit: Payer: BC Managed Care – PPO | Admitting: Family

## 2021-08-14 ENCOUNTER — Other Ambulatory Visit: Payer: Self-pay | Admitting: Family

## 2021-08-14 DIAGNOSIS — B20 Human immunodeficiency virus [HIV] disease: Secondary | ICD-10-CM

## 2021-10-02 ENCOUNTER — Emergency Department (HOSPITAL_COMMUNITY)
Admission: EM | Admit: 2021-10-02 | Discharge: 2021-10-02 | Disposition: A | Discharge status: Home / Self Care | Payer: BC Managed Care – PPO | Attending: Emergency Medicine | Admitting: Emergency Medicine

## 2021-10-02 ENCOUNTER — Emergency Department (HOSPITAL_COMMUNITY): Payer: BC Managed Care – PPO

## 2021-10-02 ENCOUNTER — Encounter (HOSPITAL_COMMUNITY): Payer: Self-pay | Admitting: Emergency Medicine

## 2021-10-02 DIAGNOSIS — S99911A Unspecified injury of right ankle, initial encounter: Secondary | ICD-10-CM | POA: Diagnosis not present

## 2021-10-02 DIAGNOSIS — S93401A Sprain of unspecified ligament of right ankle, initial encounter: Secondary | ICD-10-CM | POA: Diagnosis not present

## 2021-10-02 DIAGNOSIS — F17210 Nicotine dependence, cigarettes, uncomplicated: Secondary | ICD-10-CM | POA: Diagnosis not present

## 2021-10-02 DIAGNOSIS — Z21 Asymptomatic human immunodeficiency virus [HIV] infection status: Secondary | ICD-10-CM | POA: Diagnosis not present

## 2021-10-02 DIAGNOSIS — S8991XA Unspecified injury of right lower leg, initial encounter: Secondary | ICD-10-CM | POA: Diagnosis not present

## 2021-10-02 DIAGNOSIS — Y990 Civilian activity done for income or pay: Secondary | ICD-10-CM | POA: Insufficient documentation

## 2021-10-02 DIAGNOSIS — S81811A Laceration without foreign body, right lower leg, initial encounter: Secondary | ICD-10-CM | POA: Diagnosis not present

## 2021-10-02 DIAGNOSIS — Z23 Encounter for immunization: Secondary | ICD-10-CM | POA: Insufficient documentation

## 2021-10-02 DIAGNOSIS — W208XXA Other cause of strike by thrown, projected or falling object, initial encounter: Secondary | ICD-10-CM | POA: Diagnosis not present

## 2021-10-02 MED ORDER — TETANUS-DIPHTH-ACELL PERTUSSIS 5-2.5-18.5 LF-MCG/0.5 IM SUSY
0.5000 mL | PREFILLED_SYRINGE | Freq: Once | INTRAMUSCULAR | Status: AC
  Administered 2021-10-02: 0.5 mL via INTRAMUSCULAR
  Filled 2021-10-02: qty 0.5

## 2021-10-02 MED ORDER — IBUPROFEN 400 MG PO TABS: 600.0000 mg | ORAL_TABLET | Freq: Once | ORAL | Status: DC

## 2021-10-02 NOTE — ED Provider Notes (Signed)
I saw and evaluated the patient, reviewed the resident's note and I agree with the findings and plan.  25 year old male here after sustaining injury to his right lower extremity.  Local wound care applied.  He is stable for discharge   Lorre Nick, MD 10/02/21 1907

## 2021-10-02 NOTE — ED Provider Notes (Signed)
[facility] EMERGENCY DEPARTMENT Provider Note   CSN: [id number] Arrival date & time: 10/02/21  1409     History Chief Complaint  Patient presents with   Leg Injury    Shawn Cooke is a 25 y.o. male.   Leg Pain Location:  Leg Time since incident:  3 hours Injury: yes   Mechanism of injury comment:  Falling scrap metal at work Leg location:  R lower leg Pain details:    Quality:  Aching   Radiates to:  Does not radiate   Severity:  Moderate   Onset quality:  Gradual   Duration:  3 hours   Timing:  Constant   Progression:  Unchanged Tetanus status:  Out of date Prior injury to area:  No Relieved by:  Nothing Worsened by:  Bearing weight Associated symptoms: no back pain and no fever       Past Medical History:  Diagnosis Date   HIV infection ([place])    Influenza     Patient Active Problem List   Diagnosis Date Noted   Healthcare maintenance 08/03/2019   HIV disease ([place]) 07/03/2019   Tobacco use 07/03/2019   Anxiety-like symptoms 09/29/2017   Major depressive disorder, single episode, severe ([place]) 11/05/2016    Past Surgical History:  Procedure Laterality Date   NO PAST SURGERIES         Family History  Problem Relation Age of Onset   Lung cancer Maternal Grandfather     Social History   Tobacco Use   Smoking status: Every Day    Packs/day: 0.25    Years: 0.50    Pack years: 0.13    Types: Cigarettes   Smokeless tobacco: Never   Tobacco comments:    1 pack lasts about a week  Vaping Use   Vaping Use: Never used  Substance Use Topics   Alcohol use: Yes    Alcohol/week: 0.0 standard drinks    Comment: Occasional    Drug use: Not Currently    Types: Marijuana    Comment: occasionally    Home Medications Prior to Admission medications   Medication Sig Start Date End Date Taking? Authorizing Provider  BIKTARVY 50-200-25 MG TABS tablet TAKE 1 TABLET BY MOUTH  DAILY 08/15/21   [place] Circle, FNP     Allergies    Patient has no known allergies.  Review of Systems   Review of Systems  Constitutional:  Negative for chills and fever.  HENT:  Negative for ear pain and sore throat.   Eyes:  Negative for pain and visual disturbance.  Respiratory:  Negative for cough and shortness of breath.   Cardiovascular:  Negative for chest pain and palpitations.  Gastrointestinal:  Negative for abdominal pain and vomiting.  Genitourinary:  Negative for dysuria and hematuria.  Musculoskeletal:  Negative for arthralgias and back pain.  Skin:  Positive for wound. Negative for color change and rash.  Neurological:  Negative for seizures and syncope.  All other systems reviewed and are negative.  Physical Exam Updated Vital Signs BP 120/86 (BP Location: Left Arm)   Pulse 83   Temp 99.2 F (37.3 C) (Oral)   Resp 14   SpO2 97%   Physical Exam Vitals and nursing note reviewed.  Constitutional:      General: He is not in acute distress.    Appearance: Normal appearance. He is well-developed.  HENT:     Head: Normocephalic and atraumatic.     Right Ear: External ear  normal.     Left Ear: External ear normal.     Nose: Nose normal. No congestion.     Mouth/Throat:     Mouth: Mucous membranes are moist.     Pharynx: Oropharynx is clear. No posterior oropharyngeal erythema.  Eyes:     Extraocular Movements: Extraocular movements intact.     Conjunctiva/sclera: Conjunctivae normal.     Pupils: Pupils are equal, round, and reactive to light.  Cardiovascular:     Rate and Rhythm: Normal rate and regular rhythm.     Pulses: Normal pulses.     Heart sounds: No murmur heard. Pulmonary:     Effort: Pulmonary effort is normal. No respiratory distress.     Breath sounds: Normal breath sounds. No wheezing, rhonchi or rales.  Abdominal:     General: Abdomen is flat. Bowel sounds are normal.     Palpations: Abdomen is soft.     Tenderness: There is no abdominal tenderness. There is no guarding or  rebound.  Musculoskeletal:        General: No swelling, tenderness or deformity. Normal range of motion.     Cervical back: Normal range of motion and neck supple. No rigidity.     Comments: Multiple superficial horizontal lacerations to the right lower leg.  No active bleeding.  No obvious foreign body.  No lacerations violate the dermis.  Neurovascularly intact distally.  Limited range of motion of the right ankle secondary to pain.  Skin:    General: Skin is warm and dry.     Capillary Refill: Capillary refill takes less than 2 seconds.     Findings: No rash.  Neurological:     General: No focal deficit present.     Mental Status: He is alert and oriented to person, place, and time.  Psychiatric:        Mood and Affect: Mood normal.    ED Results / Procedures / Treatments   Labs (all labs ordered are listed, but only abnormal results are displayed) Labs Reviewed - No data to display  EKG None  Radiology DG Tibia/Fibula Right  Result Date: 10/02/2021 CLINICAL DATA:  Injury. EXAM: RIGHT ANKLE - COMPLETE 3+ VIEW; RIGHT TIBIA AND FIBULA - 2 VIEW COMPARISON:  None. FINDINGS: Right ankle: There is no evidence of fracture, dislocation, or joint effusion. There is no evidence of arthropathy or other focal bone abnormality. Soft tissues are unremarkable. Right tibia/fibula: There is no evidence of fracture, dislocation, or joint effusion. There is no evidence of arthropathy or other focal bone abnormality. Soft tissues are unremarkable. IMPRESSION: Negative right ankle and right tibia/fibula. Electronically Signed   By: Ronney Asters M.D.   On: 10/02/2021 18:31   DG Ankle Complete Right  Result Date: 10/02/2021 CLINICAL DATA:  Injury. EXAM: RIGHT ANKLE - COMPLETE 3+ VIEW; RIGHT TIBIA AND FIBULA - 2 VIEW COMPARISON:  None. FINDINGS: Right ankle: There is no evidence of fracture, dislocation, or joint effusion. There is no evidence of arthropathy or other focal bone abnormality. Soft tissues  are unremarkable. Right tibia/fibula: There is no evidence of fracture, dislocation, or joint effusion. There is no evidence of arthropathy or other focal bone abnormality. Soft tissues are unremarkable. IMPRESSION: Negative right ankle and right tibia/fibula. Electronically Signed   By: Ronney Asters M.D.   On: 10/02/2021 18:31    Procedures Procedures   Medications Ordered in ED Medications  ibuprofen (ADVIL) tablet 600 mg (has no administration in time range)  Tdap (BOOSTRIX) injection 0.5 mL (  0.5 mLs Intramuscular Given 10/02/21 1741)    ED Course  I have reviewed the triage vital signs and the nursing notes.  Pertinent labs & imaging results that were available during my care of the patient were reviewed by me and considered in my medical decision making (see chart for details).    MDM Rules/Calculators/A&P                          25 year old male with right lower leg injury as above.  Scattered superficial lacerations noted.  Hemostatic.  No indications for repair at this time.  X-rays obtained and showed no acute fractures or dislocations.  Patient is neurovascularly intact as above.  Tdap updated today.  Patient placed in Ace wrap for likely ankle sprain.  Appropriate for discharge home with close follow-up with PCP for reassessment.  Offered crutches, but patient states he has some at home.  Strict return ED precautions provided  Final Clinical Impression(s) / ED Diagnoses Final diagnoses:  Lacerations of multiple sites of right leg, initial encounter  Sprain of right ankle, unspecified ligament, initial encounter    Rx / DC Orders ED Discharge Orders     None        Lutricia Feil, MD 10/02/21 Herbie [place]    Lorre Nick, MD 10/06/21 1034

## 2021-10-02 NOTE — ED Triage Notes (Signed)
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 Patient states a metal shelf support that was standing against a wall fell and hit him in the right lower leg. Hemorrhage controlled. Patient alert, oriented, and in no apparent distress at this time.

## 2021-10-07 DIAGNOSIS — J101 Influenza due to other identified influenza virus with other respiratory manifestations: Secondary | ICD-10-CM | POA: Diagnosis not present

## 2021-10-07 DIAGNOSIS — J020 Streptococcal pharyngitis: Secondary | ICD-10-CM | POA: Diagnosis not present

## 2021-10-13 ENCOUNTER — Other Ambulatory Visit: Payer: Self-pay | Admitting: Family

## 2021-10-13 DIAGNOSIS — B20 Human immunodeficiency virus [HIV] disease: Secondary | ICD-10-CM

## 2021-10-14 NOTE — Telephone Encounter (Signed)
Left VM asking patient to return my call.    

## 2021-11-23 DIAGNOSIS — Z20822 Contact with and (suspected) exposure to covid-19: Secondary | ICD-10-CM | POA: Diagnosis not present

## 2021-11-23 DIAGNOSIS — R5383 Other fatigue: Secondary | ICD-10-CM | POA: Diagnosis not present

## 2021-11-23 DIAGNOSIS — R059 Cough, unspecified: Secondary | ICD-10-CM | POA: Diagnosis not present

## 2021-11-23 DIAGNOSIS — R6883 Chills (without fever): Secondary | ICD-10-CM | POA: Diagnosis not present

## 2021-11-23 DIAGNOSIS — J029 Acute pharyngitis, unspecified: Secondary | ICD-10-CM | POA: Diagnosis not present

## 2021-11-24 ENCOUNTER — Ambulatory Visit (INDEPENDENT_AMBULATORY_CARE_PROVIDER_SITE_OTHER): Payer: BC Managed Care – PPO | Admitting: Nurse Practitioner

## 2021-11-24 ENCOUNTER — Encounter: Payer: Self-pay | Admitting: Nurse Practitioner

## 2021-11-24 VITALS — BP 132/77 | HR 94 | Temp 97.8°F | Resp 20 | Ht 67.0 in | Wt 149.5 lb

## 2021-11-24 DIAGNOSIS — J029 Acute pharyngitis, unspecified: Secondary | ICD-10-CM | POA: Diagnosis not present

## 2021-11-24 DIAGNOSIS — J020 Streptococcal pharyngitis: Secondary | ICD-10-CM | POA: Diagnosis not present

## 2021-11-24 LAB — RAPID STREP SCREEN (MED CTR MEBANE ONLY): Strep Gp A Ag, IA W/Reflex: POSITIVE — AB

## 2021-11-24 MED ORDER — CEFDINIR 300 MG PO CAPS: 300.0000 mg | ORAL_CAPSULE | Freq: Two times a day (BID) | ORAL | 0 refills | Status: DC

## 2021-11-24 NOTE — Patient Instructions (Signed)

## 2021-11-24 NOTE — Progress Notes (Signed)
Subjective:    Patient ID: Shawn Cooke, male    DOB: 1996/04/06, 26 y.o.   MRN: [id number]   Chief Complaint: Sore throat, fever   Sore Throat  This is a new problem. The current episode started in the past 7 days. The problem has been gradually worsening. The pain is worse on the right side. The maximum temperature recorded prior to his arrival was 103 - 104 F. The fever has been present for 1 to 2 days. The pain is at a severity of 9/10. The pain is severe. Associated symptoms include headaches and trouble swallowing. Pertinent negatives include no congestion, coughing or shortness of breath. He has had no exposure to strep. Treatments tried: dayquil and sore throat spray. The treatment provided mild relief.      Review of Systems  Constitutional:  Positive for chills and fever.  HENT:  Positive for sore throat and trouble swallowing. Negative for congestion, rhinorrhea, sinus pressure and sinus pain.   Respiratory:  Negative for cough and shortness of breath.   Musculoskeletal:  Negative for myalgias.  Neurological:  Positive for headaches.      Objective:   Physical Exam Vitals and nursing note reviewed.  Constitutional:      Appearance: Normal appearance.  HENT:     Right Ear: Tympanic membrane normal.     Left Ear: Tympanic membrane normal.     Nose: Nose normal.     Mouth/Throat:     Mouth: Mucous membranes are moist.     Pharynx: Posterior oropharyngeal erythema present. No oropharyngeal exudate.  Eyes:     Pupils: Pupils are equal, round, and reactive to light.  Cardiovascular:     Rate and Rhythm: Normal rate and regular rhythm.     Heart sounds: Normal heart sounds.  Pulmonary:     Effort: Pulmonary effort is normal.     Breath sounds: Normal breath sounds.  Skin:    General: Skin is warm and dry.  Neurological:     General: No focal deficit present.     Mental Status: He is alert and oriented to person, place, and time.  Psychiatric:        Mood  and Affect: Mood normal.        Behavior: Behavior normal.   BP 132/77    Pulse 94    Temp 97.8 F (36.6 C) (Oral)    Resp 20    Ht 5\' 7"  (1.702 m)    Wt 149 lb 8 oz (67.8 kg)    SpO2 98%    BMI 23.42 kg/m   Strep positive        Assessment & Plan:  . in today with chief complaint of Sore throat, fever   1. Sore throat - Rapid Strep Screen ([facility] ONLY)  2. Strep pharyngitis Force fluids Motrin or tylenol OTC OTC decongestant Throat lozenges if help New toothbrush in 3 days Meds ordered this encounter  Medications   cefdinir (OMNICEF) 300 MG capsule    Sig: Take 1 capsule (300 mg total) by mouth 2 (two) times daily. 1 po BID    Dispense:  20 capsule    Refill:  0    Order Specific Question:   Supervising Provider    Answer:   [name] A [[id number]]       The above assessment and management plan was discussed with the patient. The patient verbalized understanding of and has agreed to the management plan. Patient  is aware to call the clinic if symptoms persist or worsen. Patient is aware when to return to the clinic for a follow-up visit. Patient educated on when it is appropriate to go to the emergency department.   Mary-Margaret Daphine Deutscher, FNP

## 2021-12-22 ENCOUNTER — Other Ambulatory Visit: Payer: Self-pay | Admitting: Family

## 2021-12-22 DIAGNOSIS — B20 Human immunodeficiency virus [HIV] disease: Secondary | ICD-10-CM

## 2021-12-22 NOTE — Telephone Encounter (Signed)
Overdue for office follow up. Message sent to patient to schedule follow up appointment

## 2022-02-05 ENCOUNTER — Other Ambulatory Visit: Payer: Self-pay | Admitting: Family

## 2022-02-05 DIAGNOSIS — B20 Human immunodeficiency virus [HIV] disease: Secondary | ICD-10-CM

## 2022-02-05 NOTE — Telephone Encounter (Signed)
Left VM - patient overdue for office follow up.  ? ? ?Shawn Cooke P Ninetta Adelstein, CMA ? ?

## 2022-02-13 ENCOUNTER — Other Ambulatory Visit: Payer: BC Managed Care – PPO

## 2022-02-16 ENCOUNTER — Ambulatory Visit: Payer: BC Managed Care – PPO | Admitting: Family Medicine

## 2022-02-16 ENCOUNTER — Encounter: Payer: Self-pay | Admitting: Family Medicine

## 2022-02-16 ENCOUNTER — Telehealth: Payer: Self-pay | Admitting: Family Medicine

## 2022-02-16 VITALS — BP 132/89 | HR 86 | Temp 99.5°F | Ht 67.0 in | Wt 159.2 lb

## 2022-02-16 DIAGNOSIS — R45851 Suicidal ideations: Secondary | ICD-10-CM | POA: Diagnosis not present

## 2022-02-16 DIAGNOSIS — F419 Anxiety disorder, unspecified: Secondary | ICD-10-CM | POA: Diagnosis not present

## 2022-02-16 DIAGNOSIS — F322 Major depressive disorder, single episode, severe without psychotic features: Secondary | ICD-10-CM | POA: Diagnosis not present

## 2022-02-16 MED ORDER — PROPRANOLOL HCL 10 MG PO TABS: 10.0000 mg | ORAL_TABLET | Freq: Three times a day (TID) | ORAL | 1 refills | Status: DC

## 2022-02-16 MED ORDER — CITALOPRAM HYDROBROMIDE 20 MG PO TABS: 20.0000 mg | ORAL_TABLET | Freq: Every day | ORAL | 3 refills | Status: DC

## 2022-02-16 NOTE — Patient Instructions (Signed)

## 2022-02-16 NOTE — Telephone Encounter (Signed)
Sounds like you did quite well with him.  Hopefully Tiffany will be able to keep him calm down.  If he does not make it to his appointment, he probably should have law enforcement check on him. ?

## 2022-02-16 NOTE — Progress Notes (Signed)
? ?Acute Office Visit ? ?Subjective:  ? ? Patient ID: Shawn Cooke, male    DOB: 08/14/1996, 26 y.o.   MRN: [id number] ? ?Chief Complaint  ?Patient presents with  ? Depression  ? ? ?HPI ?Patient is in today for depression, anxiety, and SI. He has been struggling with his work for years and feels like he has now reached a breaking point. He is now having thoughts of hurting himself. He denies a plan or intent and reports that he would never hurt himself because he would not leave his son. He does not have access to weapons. He reports feeling overwhelmed at work. He requested to step down to a less stressful position but this has been denied. Requested time off has also been denied. He reports that he has been sober for 7 months now from alcohol abuse. Reports that he was drinking due to the stress of work. He really would like to drink today because of the stress but knows that would not be beneficial for him. He also reports frequent panic attacks, especially when at work. He has never been dx with anxiety or depression and has not taken medication for either in the past. [facility] our office staff but declined to do so. He is interested in talk therapy. ? ? ?Past Medical History:  ?Diagnosis Date  ? HIV infection (HCC)   ? Influenza   ? ? ?Past Surgical History:  ?Procedure Laterality Date  ? NO PAST SURGERIES    ? ? ?Family History  ?Problem Relation Age of Onset  ? Lung cancer Maternal Grandfather   ? ? ?Social History  ? ?Socioeconomic History  ? Marital status: Single  ?  Spouse name: Not on file  ? Number of children: Not on file  ? Years of education: 5912  ? Highest education level: Not on file  ?Occupational History  ? Not on file  ?Tobacco Use  ? Smoking status: Every Day  ?  Packs/day: 0.25  ?  Years: 0.50  ?  Pack years: 0.13  ?  Types: Cigarettes  ? Smokeless tobacco: Never  ? Tobacco comments:  ?  1 pack lasts about a week  ?Vaping Use  ? Vaping Use: Never used   ?Substance and Sexual Activity  ? Alcohol use: Yes  ?  Alcohol/week: 0.0 standard drinks  ?  Comment: Occasional   ? Drug use: Not Currently  ?  Types: Marijuana  ?  Comment: occasionally  ? Sexual activity: Not Currently  ?  Partners: Male, Male  ?  Comment: GIVEN CONDOMS  ?Other Topics Concern  ? Not on file  ?Social History Narrative  ? Not on file  ? ?Social Determinants of Health  ? ?Financial Resource Strain: Not on file  ?Food Insecurity: Not on file  ?Transportation Needs: Not on file  ?Physical Activity: Not on file  ?Stress: Not on file  ?Social Connections: Not on file  ?Intimate Partner Violence: Not on file  ? ? ?Outpatient Medications Prior to Visit  ?Medication Sig Dispense Refill  ? BIKTARVY 50-200-25 MG TABS tablet TAKE 1 TABLET BY MOUTH DAILY 30 tablet 0  ? cefdinir (OMNICEF) 300 MG capsule Take 1 capsule (300 mg total) by mouth 2 (two) times daily. 1 po BID 20 capsule 0  ? ?No facility-administered medications prior to visit.  ? ? ?No Known Allergies ? ?Review of Systems ?As per HPI.  ?   ?Objective:  ?  ?  Physical Exam ?Vitals and nursing note reviewed.  ?Constitutional:   ?   General: He is not in acute distress. ?   Appearance: He is not ill-appearing, toxic-appearing or diaphoretic.  ?Eyes:  ?   Extraocular Movements: Extraocular movements intact.  ?   Conjunctiva/sclera: Conjunctivae normal.  ?   Pupils: Pupils are equal, round, and reactive to light.  ?Pulmonary:  ?   Effort: No respiratory distress.  ?   Breath sounds: Normal breath sounds.  ?Neurological:  ?   Mental Status: He is alert.  ?Psychiatric:     ?   Attention and Perception: Attention normal.     ?   Mood and Affect: Mood normal.     ?   Speech: Speech normal.     ?   Behavior: Behavior normal.  ? ? ?BP 132/89   Pulse 86   Temp 99.5 ?F (37.5 ?C) (Temporal)   Ht 5\' 7"  (1.702 m)   Wt 159 lb 4 oz (72.2 kg)   BMI 24.94 kg/m?  ?Wt Readings from Last 3 Encounters:  ?02/16/22 159 lb 4 oz (72.2 kg)  ?11/24/21 149 lb 8 oz (67.8  kg)  ?11/28/20 150 lb (68 kg)  ? ? ?Health Maintenance Due  ?Topic Date Due  ? COVID-19 Vaccine (1) Never done  ? ? ?There are no preventive care reminders to display for this patient. ? ? ?No results found for: TSH ?Lab Results  ?Component Value Date  ? WBC 5.8 11/07/2020  ? HGB 14.2 11/07/2020  ? HCT 42.1 11/07/2020  ? MCV 90.3 11/07/2020  ? PLT 298 11/07/2020  ? ?Lab Results  ?Component Value Date  ? NA 141 04/08/2021  ? K 4.4 04/08/2021  ? CO2 26 04/08/2021  ? GLUCOSE 89 04/08/2021  ? BUN 13 04/08/2021  ? CREATININE 1.05 04/08/2021  ? BILITOT 0.4 04/08/2021  ? ALKPHOS 41 07/15/2017  ? AST 19 04/08/2021  ? ALT 14 04/08/2021  ? PROT 7.4 04/08/2021  ? ALBUMIN 4.6 07/15/2017  ? CALCIUM 9.9 04/08/2021  ? ?Lab Results  ?Component Value Date  ? CHOL 88 11/07/2020  ? ?Lab Results  ?Component Value Date  ? HDL 41 11/07/2020  ? ?Lab Results  ?Component Value Date  ? LDLCALC 27 11/07/2020  ? ?Lab Results  ?Component Value Date  ? TRIG 116 11/07/2020  ? ?Lab Results  ?Component Value Date  ? CHOLHDL 2.1 11/07/2020  ? ?No results found for: HGBA1C ? ?   ?Assessment & Plan:  ? ?Shawn Cooke was seen today for depression. ? ?Diagnoses and all orders for this visit: ? ?Depression, major, single episode, severe (HCC) ?-     citalopram (CELEXA) 20 MG tablet; Take 1 tablet (20 mg total) by mouth daily. ?-     Ambulatory referral to Psychology ? ?Anxiety ?-     citalopram (CELEXA) 20 MG tablet; Take 1 tablet (20 mg total) by mouth daily. ?-     propranolol (INDERAL) 10 MG tablet; Take 1 tablet (10 mg total) by mouth 3 (three) times daily as needed.  ?-     Ambulatory referral to Psychology ? ?Suicidal ideations ?-     Ambulatory referral to Psychology ? ? ?Uncontrolled depression and anxiety with SI. Denies plan or intent. Verbal safety plan in place. Discussed process for FMLA while working to manage anxiety and depression. He will contact HR today.Start Celexa daily. Propanolol TID prn.  ? ?Return in about 2 weeks (around 03/02/2022)  for depression, anxiety. Sooner for new  or worsening symptoms.  ? ?The patient indicates understanding of these issues and agrees with the plan. ? ? ?Gabriel Earing, FNP ? ?

## 2022-02-16 NOTE — Telephone Encounter (Signed)
Front had mad patient appointment for depression before he hung up with the told them he was having dark thoughts. They sent the call to me I started talking to patient and he had to put me on a brief hold do to work. He got back on and said is job was very stressful and he is getting threats at work due to his position. I asked if he had anything thoughts of hurting his self or anyone else and he started no, but to be honest his son was the only thing keeping him here. Asked patient to go to behavorial health hospital in [place] he refused and said he could not leave work like that.  Patient was very tearful on phone. He states he will come to his appointment today at 1 with Tiffany. He is aware if he needs to call us before then he can or go straight to the hospital. FYI  ?

## 2022-02-23 ENCOUNTER — Encounter: Payer: Self-pay | Admitting: Family Medicine

## 2022-02-26 DIAGNOSIS — F4323 Adjustment disorder with mixed anxiety and depressed mood: Secondary | ICD-10-CM | POA: Diagnosis not present

## 2022-02-27 ENCOUNTER — Encounter: Payer: BC Managed Care – PPO | Admitting: Family

## 2022-03-02 ENCOUNTER — Encounter: Payer: Self-pay | Admitting: Family Medicine

## 2022-03-02 ENCOUNTER — Ambulatory Visit: Payer: BC Managed Care – PPO | Admitting: Family Medicine

## 2022-03-02 VITALS — BP 121/73 | HR 86 | Temp 97.7°F | Ht 67.0 in | Wt 162.1 lb

## 2022-03-02 DIAGNOSIS — F419 Anxiety disorder, unspecified: Secondary | ICD-10-CM

## 2022-03-02 DIAGNOSIS — F322 Major depressive disorder, single episode, severe without psychotic features: Secondary | ICD-10-CM

## 2022-03-02 DIAGNOSIS — R45851 Suicidal ideations: Secondary | ICD-10-CM

## 2022-03-02 DIAGNOSIS — F4323 Adjustment disorder with mixed anxiety and depressed mood: Secondary | ICD-10-CM | POA: Diagnosis not present

## 2022-03-02 NOTE — Patient Instructions (Signed)

## 2022-03-02 NOTE — Progress Notes (Signed)
? ?Established Patient Office Visit ? ?Subjective   ?Patient ID: Shawn Cooke, male    DOB: [birth year]  Age: 26 y.o. MRN: [id number] ? ?Chief Complaint  ?Patient presents with  ? Depression  ? ? ?HPI ?Shawn Cooke is here for depression follow up. He reports that he has been doing fair. He did establish with psych last week and has another appt today. He has been taking celexa daily and propanolol prn. He still reports SI but denies a plan or intent. He has been trying to focus on spending time with his family. He does admit that he did drink one day but has not since then. Being out of work has been helpful.  ? ? ? ?  03/02/2022  ? 10:11 AM 02/16/2022  ?  1:15 PM 11/24/2021  ?  8:10 AM  ?Depression screen PHQ 2/9  ?Decreased Interest 2 3 0  ?Down, Depressed, Hopeless 2 3 0  ?PHQ - 2 Score 4 6 0  ?Altered sleeping 2 2 0  ?Tired, decreased energy 2 3 0  ?Change in appetite 3 1 0  ?Feeling bad or failure about yourself  2 3 0  ?Trouble concentrating 2 2 0  ?Moving slowly or fidgety/restless 2 3 0  ?Suicidal thoughts 2 3 0  ?PHQ-9 Score 19 23 0  ?Difficult doing work/chores Very difficult Very difficult   ? ? ?  03/02/2022  ? 10:13 AM 02/16/2022  ?  1:16 PM 11/24/2021  ?  8:10 AM 09/29/2017  ?  9:22 AM  ?GAD 7 : Generalized Anxiety Score  ?Nervous, Anxious, on Edge 2 3 0 2  ?Control/stop worrying 2 2 0 2  ?Worry too much - different things 2 2 0 0  ?Trouble relaxing 2 3 0 3  ?Restless 2 3 0 2  ?Easily annoyed or irritable 1 3 0 1  ?Afraid - awful might happen 1 2 0 2  ?Total GAD 7 Score 12 18 0 12  ?Anxiety Difficulty Very difficult Very difficult    ? ? ? ? ?ROS ?As per HPI.  ?  ?Objective:  ?  ? ?BP 121/73   Pulse 86   Temp 97.7 ?F (36.5 ?C) (Temporal)   Ht 5\' 7"  (1.702 m)   Wt 162 lb 2 oz (73.5 kg)   BMI 25.39 kg/m?  ? ? ?Physical Exam ?Vitals and nursing note reviewed.  ?Constitutional:   ?   General: He is not in acute distress. ?   Appearance: He is not ill-appearing, toxic-appearing or diaphoretic.   ?Cardiovascular:  ?   Rate and Rhythm: Normal rate and regular rhythm.  ?   Heart sounds: Normal heart sounds. No murmur heard. ?Pulmonary:  ?   Effort: Pulmonary effort is normal. No respiratory distress.  ?   Breath sounds: Normal breath sounds.  ?Skin: ?   General: Skin is warm and dry.  ?Neurological:  ?   Mental Status: He is alert.  ?Psychiatric:     ?   Attention and Perception: Attention normal.     ?   Mood and Affect: Affect is flat.     ?   Speech: Speech normal.     ?   Behavior: Behavior is cooperative.     ?   Thought Content: Thought content includes suicidal ideation. Thought content does not include homicidal ideation. Thought content does not include homicidal or suicidal plan.  ? ? ? ?No results found for any visits on 03/02/22. ? ? ? ?The ASCVD Risk  score (Arnett DK, et al., 2019) failed to calculate for the following reasons: ?  The 2019 ASCVD risk score is only valid for ages 19 to 40 ? ?  ?Assessment & Plan:  ? ?Shawn Cooke was seen today for depression. ? ?Diagnoses and all orders for this visit: ? ?Depression, major, single episode, severe (HCC) ?Suicidal ideations ?Anxiety ?Minimal improvement since last visit. Continue celexa and follow up with psychiatry for further management. Reports he will have weekly psych appts. Reports SI but denies plan or intent. Handout given with national hotline number. Discussed lifestyle modifications. Recommend remain out of work until next evaluation.  ? ? ?Return in about 4 weeks (around 03/30/2022) for follow up. ? ?The patient indicates understanding of these issues and agrees with the plan. ?  ?Gabriel Earing, FNP ? ?

## 2022-03-03 ENCOUNTER — Emergency Department (HOSPITAL_COMMUNITY)
Admission: EM | Admit: 2022-03-03 | Discharge: 2022-03-04 | Disposition: A | Discharge status: Home / Self Care | Payer: BC Managed Care – PPO | Attending: Emergency Medicine | Admitting: Emergency Medicine

## 2022-03-03 ENCOUNTER — Other Ambulatory Visit: Payer: Self-pay

## 2022-03-03 DIAGNOSIS — F32A Depression, unspecified: Secondary | ICD-10-CM | POA: Diagnosis not present

## 2022-03-03 DIAGNOSIS — F1010 Alcohol abuse, uncomplicated: Secondary | ICD-10-CM

## 2022-03-03 DIAGNOSIS — R1010 Upper abdominal pain, unspecified: Secondary | ICD-10-CM | POA: Diagnosis not present

## 2022-03-03 DIAGNOSIS — R112 Nausea with vomiting, unspecified: Secondary | ICD-10-CM | POA: Insufficient documentation

## 2022-03-03 DIAGNOSIS — R45851 Suicidal ideations: Secondary | ICD-10-CM | POA: Diagnosis not present

## 2022-03-03 DIAGNOSIS — Z20822 Contact with and (suspected) exposure to covid-19: Secondary | ICD-10-CM | POA: Diagnosis not present

## 2022-03-03 DIAGNOSIS — Y900 Blood alcohol level of less than 20 mg/100 ml: Secondary | ICD-10-CM | POA: Insufficient documentation

## 2022-03-03 DIAGNOSIS — F10129 Alcohol abuse with intoxication, unspecified: Secondary | ICD-10-CM | POA: Diagnosis not present

## 2022-03-03 DIAGNOSIS — R1111 Vomiting without nausea: Secondary | ICD-10-CM | POA: Diagnosis not present

## 2022-03-03 DIAGNOSIS — R1084 Generalized abdominal pain: Secondary | ICD-10-CM | POA: Diagnosis not present

## 2022-03-03 NOTE — ED Triage Notes (Signed)
Pt BIB EMS with reports of ETOH/ SI. Pt reports feeling suicidal this morning.  ?

## 2022-03-04 DIAGNOSIS — F1010 Alcohol abuse, uncomplicated: Secondary | ICD-10-CM

## 2022-03-04 LAB — CBC
HCT: 46.7 % (ref 39.0–52.0)
Hemoglobin: 15.7 g/dL (ref 13.0–17.0)
MCH: 30.3 pg (ref 26.0–34.0)
MCHC: 33.6 g/dL (ref 30.0–36.0)
MCV: 90.2 fL (ref 80.0–100.0)
Platelets: 347 10*3/uL (ref 150–400)
RBC: 5.18 MIL/uL (ref 4.22–5.81)
RDW: 12.6 % (ref 11.5–15.5)
WBC: 9.6 10*3/uL (ref 4.0–10.5)
nRBC: 0 % (ref 0.0–0.2)

## 2022-03-04 LAB — RAPID URINE DRUG SCREEN, HOSP PERFORMED
Amphetamines: NOT DETECTED
Barbiturates: NOT DETECTED
Benzodiazepines: NOT DETECTED
Cocaine: NOT DETECTED
Opiates: NOT DETECTED
Tetrahydrocannabinol: POSITIVE — AB

## 2022-03-04 LAB — COMPREHENSIVE METABOLIC PANEL
ALT: 21 U/L (ref 0–44)
AST: 25 U/L (ref 15–41)
Albumin: 4.6 g/dL (ref 3.5–5.0)
Alkaline Phosphatase: 38 U/L (ref 38–126)
Anion gap: 9 (ref 5–15)
BUN: 13 mg/dL (ref 6–20)
CO2: 24 mmol/L (ref 22–32)
Calcium: 9.6 mg/dL (ref 8.9–10.3)
Chloride: 107 mmol/L (ref 98–111)
Creatinine, Ser: 1.14 mg/dL (ref 0.61–1.24)
GFR, Estimated: >60 mL/min (ref >60)
Glucose, Bld: 119 mg/dL — ABNORMAL HIGH (ref 70–99)
Potassium: 4.2 mmol/L (ref 3.5–5.1)
Sodium: 140 mmol/L (ref 135–145)
Total Bilirubin: 1.1 mg/dL (ref 0.3–1.2)
Total Protein: 8.5 g/dL — ABNORMAL HIGH (ref 6.5–8.1)

## 2022-03-04 LAB — ETHANOL: Alcohol, Ethyl (B): <10 mg/dL (ref <10)

## 2022-03-04 LAB — RESP PANEL BY RT-PCR (FLU A&B, COVID) ARPGX2
Influenza A by PCR: NEGATIVE
Influenza B by PCR: NEGATIVE
SARS Coronavirus 2 by RT PCR: NEGATIVE

## 2022-03-04 LAB — ACETAMINOPHEN LEVEL: Acetaminophen (Tylenol), Serum: <10 ug/mL — ABNORMAL LOW (ref 10–30)

## 2022-03-04 LAB — SALICYLATE LEVEL: Salicylate Lvl: <7 mg/dL — ABNORMAL LOW (ref 7.0–30.0)

## 2022-03-04 MED ORDER — ONDANSETRON 8 MG PO TBDP
8.0000 mg | ORAL_TABLET | Freq: Once | ORAL | Status: AC
  Administered 2022-03-04: 8 mg via ORAL
  Filled 2022-03-04: qty 1

## 2022-03-04 MED ORDER — DICYCLOMINE HCL 10 MG PO CAPS
10.0000 mg | ORAL_CAPSULE | Freq: Once | ORAL | Status: AC
  Administered 2022-03-04: 10 mg via ORAL
  Filled 2022-03-04: qty 1

## 2022-03-04 MED ORDER — LIDOCAINE VISCOUS HCL 2 % MT SOLN
15.0000 mL | Freq: Once | OROMUCOSAL | Status: AC
  Administered 2022-03-04: 15 mL via ORAL
  Filled 2022-03-04: qty 15

## 2022-03-04 MED ORDER — ALUM & MAG HYDROXIDE-SIMETH 200-200-20 MG/5ML PO SUSP
30.0000 mL | Freq: Once | ORAL | Status: AC
  Administered 2022-03-04: 30 mL via ORAL
  Filled 2022-03-04: qty 30

## 2022-03-04 NOTE — ED Notes (Addendum)
Provided pt with a cup of water and ginger ale. ?

## 2022-03-04 NOTE — Consult Note (Signed)
[facility] Psych ED Discharge ? ?03/04/2022 1:41 PM ?Shawn Cooke  ?MRN:  [id number] ? ?Method of visit?: Face to Face  ? ?Principal Problem: <principal problem not specified> ?Discharge Diagnoses: Active Problems: ?  * No active hospital problems. * ? ? ?Subjective: Shawn Cooke is a 26 y.o. male patient admitted with hx significant for depression and anxiety and HIV  brought to the ER for abdominal pain and suicide ideation.   AA male, 26 years old came to the ER for suicide ideation and abdominal pain.  He was sober for 4 months but drank alcohol yesterday and since then have been having nausea and vomiting.  He also reported that he has been having"bad thought" as in wanting to hurt himself for some time.  He is currently seeing a Psychiatrist Landis Martins recently started therapy as well.  Today he denied feeling suicidal stating he has a 26 years old boy that gives him the courage to live on and help raise him.  He reported that he is depressed and anxious due to his job demand.  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  He is currently on FMLA and he reported that his job is supportive of him.  He is currently on his Celexa and Propranolol for depression and anxiety.  Patient is engaged in Mental health care and therapy and is discharged and advised to follow up with his providers. ? ?Total Time spent with patient: 30 minutes ? ?Past Psychiatric History: Depression, anxiety, sees Landis Martins, Psychiatrist.  Recently started therapy.  No inpatient Psych admission ? ?Past Medical History:  ?Past Medical History:  ?Diagnosis Date  ? HIV infection (HCC)   ? Influenza   ?  ?Past Surgical History:  ?Procedure Laterality Date  ? NO PAST SURGERIES    ? ?Family History:  ?Family History  ?Problem Relation Age of Onset  ? Lung cancer Maternal Grandfather   ? ?Family Psychiatric  History: Unknown, never talked about mental illness in  the family but maternal uncle completed suicide ?Social History:  ?Social History  ? ?Substance and Sexual Activity  ?Alcohol Use Yes  ? Alcohol/week: 0.0 standard drinks  ? Comment: Occasional   ?   ?Social History  ? ?Substance and Sexual Activity  ?Drug Use Not Currently  ? Types: Marijuana  ? Comment: occasionally  ?  ?Social History  ? ?Socioeconomic History  ? Marital status: Single  ?  Spouse name: Not on file  ? Number of children: Not on file  ? Years of education: 46  ? Highest education level: Not on file  ?Occupational History  ? Not on file  ?Tobacco Use  ? Smoking status: Every Day  ?  Packs/day: 0.25  ?  Years: 0.50  ?  Pack years: 0.13  ?  Types: Cigarettes  ? Smokeless tobacco: Never  ? Tobacco comments:  ?  1 pack lasts about a week  ?Vaping Use  ? Vaping Use: Never used  ?Substance and Sexual Activity  ? Alcohol use: Yes  ?  Alcohol/week: 0.0 standard drinks  ?  Comment: Occasional   ? Drug use: Not Currently  ?  Types: Marijuana  ?  Comment: occasionally  ? Sexual activity: Not Currently  ?  Partners: Male, Male  ?  Comment: GIVEN CONDOMS  ?Other Topics Concern  ? Not on file  ?Social History Narrative  ? Not on file  ? ?  Social Determinants of Health  ? ?Financial Resource Strain: Not on file  ?Food Insecurity: Not on file  ?Transportation Needs: Not on file  ?Physical Activity: Not on file  ?Stress: Not on file  ?Social Connections: Not on file  ? ? ?Tobacco Cessation:  A prescription for an FDA-approved tobacco cessation medication provided at discharge ? ?Current Medications: ?No current facility-administered medications for this encounter.  ? ?Current Outpatient Medications  ?Medication Sig Dispense Refill  ? BIKTARVY 50-200-25 MG TABS tablet TAKE 1 TABLET BY MOUTH DAILY 30 tablet 0  ? citalopram (CELEXA) 20 MG tablet Take 1 tablet (20 mg total) by mouth daily. 30 tablet 3  ? propranolol (INDERAL) 10 MG tablet Take 1 tablet (10 mg total) by mouth 3 (three) times daily. 90 tablet 1  ? ?PTA  Medications: ?(Not in a hospital admission) ? ? ?Musculoskeletal: ?Strength & Muscle Tone: within normal limits ?Gait & Station: normal ?Patient leans:  see abuse ? ?Psychiatric Specialty Exam: ? ?Presentation  ?General Appearance: Appropriate for Environment; Fairly Groomed ? ?Eye Contact:Good ? ?Speech:Clear and Coherent; Normal Rate ? ?Speech Volume:Normal ? ?Handedness:Right ? ? ?Mood and Affect  ?Mood:Depressed; Anxious ? ?Affect:Congruent ? ? ?Thought Process  ?Thought Processes:Coherent ? ?Descriptions of Associations:Intact ? ?Orientation:Full (Time, Place and Person) ? ?Thought Content:Logical ? ?History of Schizophrenia/Schizoaffective disorder:No data recorded ?Duration of Psychotic Symptoms:No data recorded ?Hallucinations:Hallucinations: None ? ?Ideas of Reference:None ? ?Suicidal Thoughts:Suicidal Thoughts: No ? ?Homicidal Thoughts:Homicidal Thoughts: No ? ? ?Sensorium  ?Memory:Immediate Good; Recent Good; Remote Good ? ?Judgment:Good ? ?Insight:Good ? ? ?Executive Functions  ?Concentration:Good ? ?Attention Span:Good ? ?Recall:Good ? ?Fund of Knowledge:Good ? ?Language:Good ? ? ?Psychomotor Activity  ?Psychomotor Activity:Psychomotor Activity: Normal ? ? ?Assets  ?Assets:Communication Skills; Desire for Improvement; Housing; Physical Health; Financial Resources/Insurance ? ? ?Sleep  ?Sleep:Sleep: Fair ? ? ? ?Physical Exam: ?Physical Exam ?Vitals and nursing note reviewed.  ?Constitutional:   ?   Appearance: Normal appearance.  ?HENT:  ?   Head: Normocephalic.  ?   Nose: Nose normal.  ?Cardiovascular:  ?   Rate and Rhythm: Normal rate and regular rhythm.  ?Pulmonary:  ?   Effort: Pulmonary effort is normal.  ?Musculoskeletal:     ?   General: Normal range of motion.  ?   Cervical back: Normal range of motion.  ?Skin: ?   General: Skin is warm and dry.  ?Neurological:  ?   General: No focal deficit present.  ?   Mental Status: He is alert and oriented to person, place, and time.  ? ?Review of Systems   ?Constitutional: Negative.   ?HENT: Negative.    ?Eyes: Negative.   ?Respiratory: Negative.    ?Cardiovascular: Negative.   ?Gastrointestinal: Negative.   ?Genitourinary: Negative.   ?Musculoskeletal: Negative.   ?Skin: Negative.   ?Neurological: Negative.   ?Endo/Heme/Allergies: Negative.   ?Psychiatric/Behavioral:  Positive for depression. The patient is nervous/anxious.   ?Blood pressure 130/70, pulse 72, temperature 98.6 ?F (37 ?C), resp. rate 20, SpO2 100 %. There is no height or weight on file to calculate BMI. ? ? ?Demographic Factors:  ?Male and Adolescent or young adult ? ?Loss Factors: ?NA ? ?Historical Factors: ?Family history of suicide and Alcohol relapse ? ?Risk Reduction Factors:   ?Responsible for children under 26 years of age and Living with another person, especially a relative ? ?Continued Clinical Symptoms:  ?Depression:   Comorbid alcohol abuse/dependence ? ?Cognitive Features That Contribute To Risk:  ?None   ? ?Suicide Risk:  ?Minimal:  No identifiable suicidal ideation.  Patients presenting with no risk factors but with morbid ruminations; may be classified as minimal risk based on the severity of the depressive symptoms ? ? ?Plan Of Care/Follow-up recommendations:  ?Activity:  as tolerated ?Diet:  regular ? ?Disposition: Discharge ?Earney Navy, NP-PMHNP-BC ?03/04/2022, 1:41 PM ? ?

## 2022-03-04 NOTE — ED Notes (Signed)
Pt's belongings including shirt and pants were placed in belonging bag and labeled, placed in triage belonging cabinet. Pt's sister took pt's phone home for safekeeping per pt request. ?

## 2022-03-04 NOTE — ED Notes (Signed)
Pt was informed of [facility] Policy regarding dressing out into hospital attire, all belongings to be placed into belonging bags and labeled with pt ID. Pt acknowledged these instructions I provided. Pt had no further questions or concerns at this time.  

## 2022-03-04 NOTE — Discharge Instructions (Addendum)
For your behavioral health needs you are advised to continue treatment with your current outpatient provider.  If you are considering changing providers you are advised to discuss this with your current provider.  Other providers to consider in this area include: ? ?     [facility] ?     [address] ?     [place], [place] [address] ?     [phone]  ? ?     [facility] ?     [address] [place] ?     [place], [place] [address] ?     [phone]  ? ?     [facility] ?     [address] ?     [place], [place] [address] ?     [phone]  ?

## 2022-03-04 NOTE — ED Provider Notes (Signed)
?[facility]-EMERGENCY DEPT ?Provider Note ? ? ?CSN: [id number] ?Arrival date & time: 03/03/22  2346 ? ?  ? ?History ? ?Chief Complaint  ?Patient presents with  ? Alcohol Intoxication  ? Suicidal  ? ? ?Shawn Cooke is a 26 y.o. male. ? ?26 year old male presents the ER today secondary to abdominal pain and suicidal thoughts.  Patient states that he has been sober for a while but then relapsed and drank this morning.  Ever since then he has been having pretty significant nausea and vomiting.  Patient states that he now has upper abdominal cramping.  He feels like he needs IV fluids.  He also told his roommates that he went to kill himself food but not revealing his plan to me at this time.  No fevers.  No diarrhea.  Decreased p.o. intake today. ? ? ?Alcohol Intoxication ? ? ?  ? ?Home Medications ?Prior to Admission medications   ?Medication Sig Start Date End Date Taking? Authorizing Provider  ?BIKTARVY 50-200-25 MG TABS tablet TAKE 1 TABLET BY MOUTH DAILY 12/22/21   Veryl Speak, FNP  ?citalopram (CELEXA) 20 MG tablet Take 1 tablet (20 mg total) by mouth daily. 02/16/22   Gabriel Earing, FNP  ?propranolol (INDERAL) 10 MG tablet Take 1 tablet (10 mg total) by mouth 3 (three) times daily. 02/16/22   Gabriel Earing, FNP  ?   ? ?Allergies    ?Patient has no known allergies.   ? ?Review of Systems   ?Review of Systems ? ?Physical Exam ?Updated Vital Signs ?BP 121/86 (BP Location: Left Arm)   Pulse 76   Temp 97.8 ?F (36.6 ?C) (Oral)   Resp 16   SpO2 100%  ?Physical Exam ?Vitals and nursing note reviewed.  ?Constitutional:   ?   Appearance: He is well-developed.  ?HENT:  ?   Head: Normocephalic and atraumatic.  ?   Nose: Nose normal. No congestion or rhinorrhea.  ?   Mouth/Throat:  ?   Mouth: Mucous membranes are moist.  ?   Pharynx: Oropharynx is clear.  ?Eyes:  ?   Pupils: Pupils are equal, round, and reactive to light.  ?Cardiovascular:  ?   Rate and Rhythm: Normal rate.  ?Pulmonary:   ?   Effort: Pulmonary effort is normal. No respiratory distress.  ?Abdominal:  ?   General: Abdomen is flat. There is no distension.  ?Musculoskeletal:     ?   General: No swelling or tenderness. Normal range of motion.  ?   Cervical back: Normal range of motion.  ?Skin: ?   General: Skin is warm and dry.  ?   Coloration: Skin is not jaundiced or pale.  ?Neurological:  ?   General: No focal deficit present.  ?   Mental Status: He is alert.  ? ? ?ED Results / Procedures / Treatments   ?Labs ?(all labs ordered are listed, but only abnormal results are displayed) ?Labs Reviewed  ?COMPREHENSIVE METABOLIC PANEL - Abnormal; Notable for the following components:  ?    Result Value  ? Glucose, Bld 119 (*)   ? Total Protein 8.5 (*)   ? All other components within normal limits  ?SALICYLATE LEVEL - Abnormal; Notable for the following components:  ? Salicylate Lvl <7.0 (*)   ? All other components within normal limits  ?ACETAMINOPHEN LEVEL - Abnormal; Notable for the following components:  ? Acetaminophen (Tylenol), Serum <10 (*)   ? All other components within normal limits  ?RESP PANEL  BY RT-PCR (FLU A&B, COVID) ARPGX2  ?ETHANOL  ?CBC  ?RAPID URINE DRUG SCREEN, HOSP PERFORMED  ? ? ?EKG ?None ? ?Radiology ?No results found. ? ?Procedures ?Procedures  ? ? ?Medications Ordered in ED ?Medications  ?alum & mag hydroxide-simeth (MAALOX/MYLANTA) 200-200-20 MG/5ML suspension 30 mL (30 mLs Oral Given 03/04/22 0118)  ?  And  ?lidocaine (XYLOCAINE) 2 % viscous mouth solution 15 mL (15 mLs Oral Given 03/04/22 0118)  ?ondansetron (ZOFRAN-ODT) disintegrating tablet 8 mg (8 mg Oral Given 03/04/22 0118)  ?dicyclomine (BENTYL) capsule 10 mg (10 mg Oral Given 03/04/22 0124)  ? ? ?ED Course/ Medical Decision Making/ A&P ?  ?                        ?Medical Decision Making ?Amount and/or Complexity of Data Reviewed ?Labs: ordered. ? ?Risk ?OTC drugs. ?Prescription drug management. ? ? ?Patient likely with gastritis versus just muscular soreness from  all the emesis.  We will treat for the same.  TTS consult placed as he is medically cleared at this time.  Low suspicion for significant intra-abdominal processes.  No indication for IV fluids he is not hypotensive, tachycardia or any other evidence of significant dehydration requiring IV fluids. ? ?Final Clinical Impression(s) / ED Diagnoses ?Final diagnoses:  ?None  ? ? ?Rx / DC Orders ?ED Discharge Orders   ? ? None  ? ?  ? ? ?  ?Marily Memos, MD ?03/04/22 0127 ? ?

## 2022-03-04 NOTE — BH Assessment (Signed)
[facility] Assessment Progress Note ?  ?Per Dahlia Byes, NP, this voluntary pt does not require psychiatric hospitalization at this time.  Pt is psychiatrically cleared.  Discharge instructions advise pt to continue treatment with his current outpatient provider, but also include several area alternatives.  EDP Lorre Nick, MD and pt's nurse, Whitney Post, have been notified. ? ?Doylene Canning, MA ?Triage Specialist ?[phone] ? ?

## 2022-03-04 NOTE — ED Notes (Signed)
Pt currently calm and cooperative with staff. Polite. ?

## 2022-03-10 ENCOUNTER — Other Ambulatory Visit: Payer: Self-pay | Admitting: Family Medicine

## 2022-03-10 DIAGNOSIS — F322 Major depressive disorder, single episode, severe without psychotic features: Secondary | ICD-10-CM

## 2022-03-10 DIAGNOSIS — F419 Anxiety disorder, unspecified: Secondary | ICD-10-CM

## 2022-03-13 DIAGNOSIS — J020 Streptococcal pharyngitis: Secondary | ICD-10-CM | POA: Diagnosis not present

## 2022-03-13 DIAGNOSIS — Z6825 Body mass index (BMI) 25.0-25.9, adult: Secondary | ICD-10-CM | POA: Diagnosis not present

## 2022-03-13 DIAGNOSIS — J029 Acute pharyngitis, unspecified: Secondary | ICD-10-CM | POA: Diagnosis not present

## 2022-03-29 ENCOUNTER — Other Ambulatory Visit: Payer: Self-pay | Admitting: Family Medicine

## 2022-03-29 DIAGNOSIS — F419 Anxiety disorder, unspecified: Secondary | ICD-10-CM

## 2022-04-01 ENCOUNTER — Ambulatory Visit: Payer: BC Managed Care – PPO | Admitting: Family Medicine

## 2022-04-02 ENCOUNTER — Encounter: Payer: Self-pay | Admitting: Family Medicine

## 2022-04-02 ENCOUNTER — Ambulatory Visit (INDEPENDENT_AMBULATORY_CARE_PROVIDER_SITE_OTHER): Payer: BC Managed Care – PPO | Admitting: Family Medicine

## 2022-04-02 DIAGNOSIS — F322 Major depressive disorder, single episode, severe without psychotic features: Secondary | ICD-10-CM

## 2022-04-02 DIAGNOSIS — F419 Anxiety disorder, unspecified: Secondary | ICD-10-CM

## 2022-04-02 MED ORDER — CITALOPRAM HYDROBROMIDE 40 MG PO TABS: 40.0000 mg | ORAL_TABLET | Freq: Every day | ORAL | 1 refills | Status: DC

## 2022-04-02 NOTE — Patient Instructions (Signed)

## 2022-04-02 NOTE — Progress Notes (Signed)
Established Patient Office Visit  Subjective   Patient ID: Shawn Cooke, male    DOB: Oct 10, 1996  Age: 26 y.o. MRN: [id number]  Chief Complaint  Patient presents with   Depression    Depression       [name] reports feeling better. He has been taking celexa daily. He tried propanolol but didn't like how it made him feel. He has been exercising and this has been helpful. He continues to do therapy weekly and feels like this is helpful as well. He also has been looking for new jobs and has had a few interviews. He denies plan or intent to harm himself or others.       03/02/2022   10:11 AM 02/16/2022    1:15 PM 11/24/2021    8:10 AM  Depression screen PHQ 2/9  Decreased Interest 2 3 0  Down, Depressed, Hopeless 2 3 0  PHQ - 2 Score 4 6 0  Altered sleeping 2 2 0  Tired, decreased energy 2 3 0  Change in appetite 3 1 0  Feeling bad or failure about yourself  2 3 0  Trouble concentrating 2 2 0  Moving slowly or fidgety/restless 2 3 0  Suicidal thoughts 2 3 0  PHQ-9 Score 19 23 0  Difficult doing work/chores Very difficult Very difficult       03/02/2022   10:13 AM 02/16/2022    1:16 PM 11/24/2021    8:10 AM 09/29/2017    9:22 AM  GAD 7 : Generalized Anxiety Score  Nervous, Anxious, on Edge 2 3 0 2  Control/stop worrying 2 2 0 2  Worry too much - different things 2 2 0 0  Trouble relaxing 2 3 0 3  Restless 2 3 0 2  Easily annoyed or irritable 1 3 0 1  Afraid - awful might happen 1 2 0 2  Total GAD 7 Score 12 18 0 12  Anxiety Difficulty Very difficult Very difficult       Past Medical History:  Diagnosis Date   HIV infection (HCC)    Influenza       Review of Systems  Psychiatric/Behavioral:  Positive for depression.   As per HPI.    Objective:     There were no vitals taken for this visit. BP Readings from Last 3 Encounters:  04/02/22 115/65  03/04/22 (!) 147/81  03/02/22 121/73      Physical Exam Vitals and nursing note reviewed.   Constitutional:      General: He is not in acute distress.    Appearance: He is not ill-appearing, toxic-appearing or diaphoretic.  Cardiovascular:     Rate and Rhythm: Normal rate and regular rhythm.     Heart sounds: Normal heart sounds. No murmur heard. Pulmonary:     Effort: Pulmonary effort is normal. No respiratory distress.     Breath sounds: Normal breath sounds.  Musculoskeletal:     Right lower leg: No edema.     Left lower leg: No edema.  Skin:    General: Skin is warm and dry.  Neurological:     General: No focal deficit present.     Mental Status: He is alert and oriented to person, place, and time.  Psychiatric:        Mood and Affect: Mood normal.        Behavior: Behavior normal.     No results found for any visits on 04/02/22.    The ASCVD Risk score (Arnett  DK, et al., 2019) failed to calculate for the following reasons:   The 2019 ASCVD risk score is only valid for ages 45 to 72    Assessment & Plan:   [name] was seen today for depression.  Diagnoses and all orders for this visit:  Depression, major, single episode, severe (HCC) Anxiety Improving but now well controlled. Denies plan or intent for self harm or to harm others. Increase celexa. Continue therapy.  -     citalopram (CELEXA) 40 MG tablet; Take 1 tablet (40 mg total) by mouth daily.  Return in about 6 weeks (around 05/14/2022) for medication follow up.   The patient indicates understanding of these issues and agrees with the plan.  Gabriel Earing, FNP

## 2022-04-15 ENCOUNTER — Other Ambulatory Visit: Payer: Self-pay | Admitting: Family Medicine

## 2022-05-13 ENCOUNTER — Emergency Department
Admission: EM | Admit: 2022-05-13 | Discharge: 2022-05-13 | Discharge status: Against Medical Advice | Payer: BC Managed Care – PPO

## 2022-05-13 DIAGNOSIS — R1031 Right lower quadrant pain: Secondary | ICD-10-CM | POA: Diagnosis not present

## 2022-05-13 DIAGNOSIS — B20 Human immunodeficiency virus [HIV] disease: Secondary | ICD-10-CM | POA: Diagnosis not present

## 2022-05-13 DIAGNOSIS — R112 Nausea with vomiting, unspecified: Secondary | ICD-10-CM | POA: Diagnosis not present

## 2022-05-13 DIAGNOSIS — R1030 Lower abdominal pain, unspecified: Secondary | ICD-10-CM | POA: Diagnosis not present

## 2022-05-21 ENCOUNTER — Encounter: Payer: Self-pay | Admitting: Family Medicine

## 2022-05-21 ENCOUNTER — Ambulatory Visit: Payer: BC Managed Care – PPO | Admitting: Family Medicine

## 2022-05-21 VITALS — BP 137/79 | HR 109 | Temp 98.3°F | Ht 67.0 in | Wt 163.1 lb

## 2022-05-21 DIAGNOSIS — F322 Major depressive disorder, single episode, severe without psychotic features: Secondary | ICD-10-CM | POA: Diagnosis not present

## 2022-05-21 DIAGNOSIS — F419 Anxiety disorder, unspecified: Secondary | ICD-10-CM

## 2022-05-21 NOTE — Progress Notes (Signed)
Established Patient Office Visit  Subjective   Patient ID: Shawn Cooke, male    DOB: [birth year]  Age: 26 y.o. MRN: [id number]  Chief Complaint  Patient presents with   Depression    Depression        He reports doing well overall. He is taking medication as prescribed without side effects. He is able to care for himself currently without difficulty. He is well groomed today and in good spirits. He continues to complete therapy weekly. This has been very helpful. He does continue to have some anxiety but feels that he can manage this better than previously. He no longer feels like his judgement is no longer impaired. He denies SI. 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 He is excited about this. He feels like he is capable of returning to work now.      05/21/2022   10:45 AM 04/02/2022   11:59 AM 03/02/2022   10:11 AM  Depression screen PHQ 2/9  Decreased Interest 1 2 2   Down, Depressed, Hopeless 1 2 2   PHQ - 2 Score 2 4 4   Altered sleeping 1 2 2   Tired, decreased energy 1 2 2   Change in appetite 0 3 3  Feeling bad or failure about yourself  1 2 2   Trouble concentrating 0 1 2  Moving slowly or fidgety/restless 0 1 2  Suicidal thoughts 0 1 2  PHQ-9 Score 5 16 19   Difficult doing work/chores Somewhat difficult Somewhat difficult Very difficult      04/02/2022   12:00 PM 03/02/2022   10:13 AM 02/16/2022    1:16 PM 11/24/2021    8:10 AM  GAD 7 : Generalized Anxiety Score  Nervous, Anxious, on Edge 2 2 3  0  Control/stop worrying 1 2 2  0  Worry too much - different things 1 2 2  0  Trouble relaxing 2 2 3  0  Restless 2 2 3  0  Easily annoyed or irritable 1 1 3  0  Afraid - awful might happen 2 1 2  0  Total GAD 7 Score 11 12 18  0  Anxiety Difficulty Somewhat difficult Very difficult Very difficult    Past Medical History:  Diagnosis Date   HIV  infection (HCC)    Influenza     Review of Systems  Psychiatric/Behavioral:  Positive for depression.    As per HPI.    Objective:     BP 137/79   Pulse (!) 109   Temp 98.3 F (36.8 C) (Temporal)   Ht 5\' 7"  (1.702 m)   Wt 163 lb 2 oz (74 kg)   SpO2 98%   BMI 25.55 kg/m    Physical Exam Vitals and nursing note reviewed.  Constitutional:      General: He is not in acute distress.    Appearance: Normal appearance. He is well-groomed. He is not ill-appearing, toxic-appearing or diaphoretic.  Cardiovascular:     Rate and Rhythm: Normal rate and regular rhythm.     Pulses: Normal pulses.     Heart sounds: Normal heart sounds. No murmur heard. Pulmonary:     Effort: Pulmonary effort is normal. No respiratory distress.  Abdominal:     General: Bowel sounds are normal. There is no distension.     Palpations: Abdomen is soft.     Tenderness: There is no abdominal  tenderness. There is no guarding or rebound.  Musculoskeletal:     Right lower leg: No edema.     Left lower leg: No edema.  Skin:    General: Skin is warm and dry.  Neurological:     General: No focal deficit present.     Mental Status: He is alert and oriented to person, place, and time.     Motor: No weakness.     Gait: Gait normal.  Psychiatric:        Attention and Perception: Attention normal.        Mood and Affect: Mood normal.        Speech: Speech normal.        Behavior: Behavior normal. Behavior is cooperative.        Thought Content: Thought content normal.        Cognition and Memory: Cognition and memory normal.        Judgment: Judgment normal.     No results found for any visits on 05/21/22.    The ASCVD Risk score (Arnett DK, et al., 2019) failed to calculate for the following reasons:   The 2019 ASCVD risk score is only valid for ages 78 to 53    Assessment & Plan:  Shawn Cooke was seen today for depression.  Diagnoses and all orders for this visit:  Depression, major, single  episode, severe (HCC) Currently well controlled without impaired judgment or SI. Continue celexa and weekly therapy appointment. Released to return to work.   Anxiety Fairly well controlled, improving. Continue celexa and therapy.    Return in about 3 months (around 08/21/2022) for CPE.  The patient indicates understanding of these issues and agrees with the plan.    Gabriel Earing, FNP

## 2022-06-20 IMAGING — CR DG ANKLE COMPLETE 3+V*R*
3 series · 3 of 3 positions shown · non-contrast
Comparison: None.

CLINICAL DATA: Injury.

EXAM:
RIGHT ANKLE - COMPLETE 3+ VIEW; RIGHT TIBIA AND FIBULA - 2 VIEW

[ankle ap]
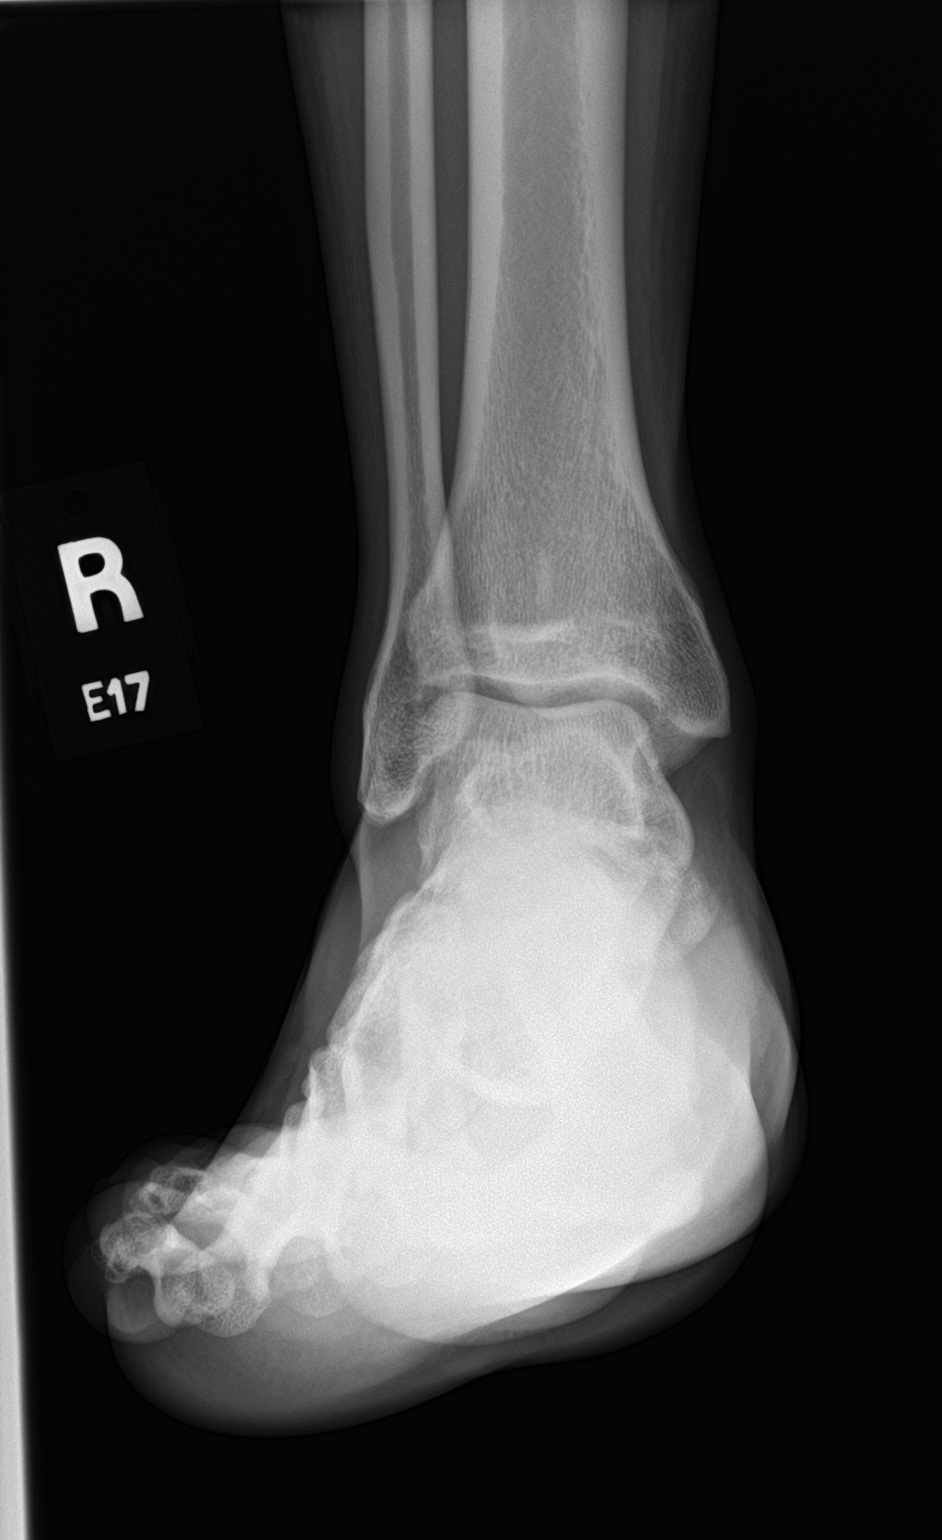

[ankle obl]
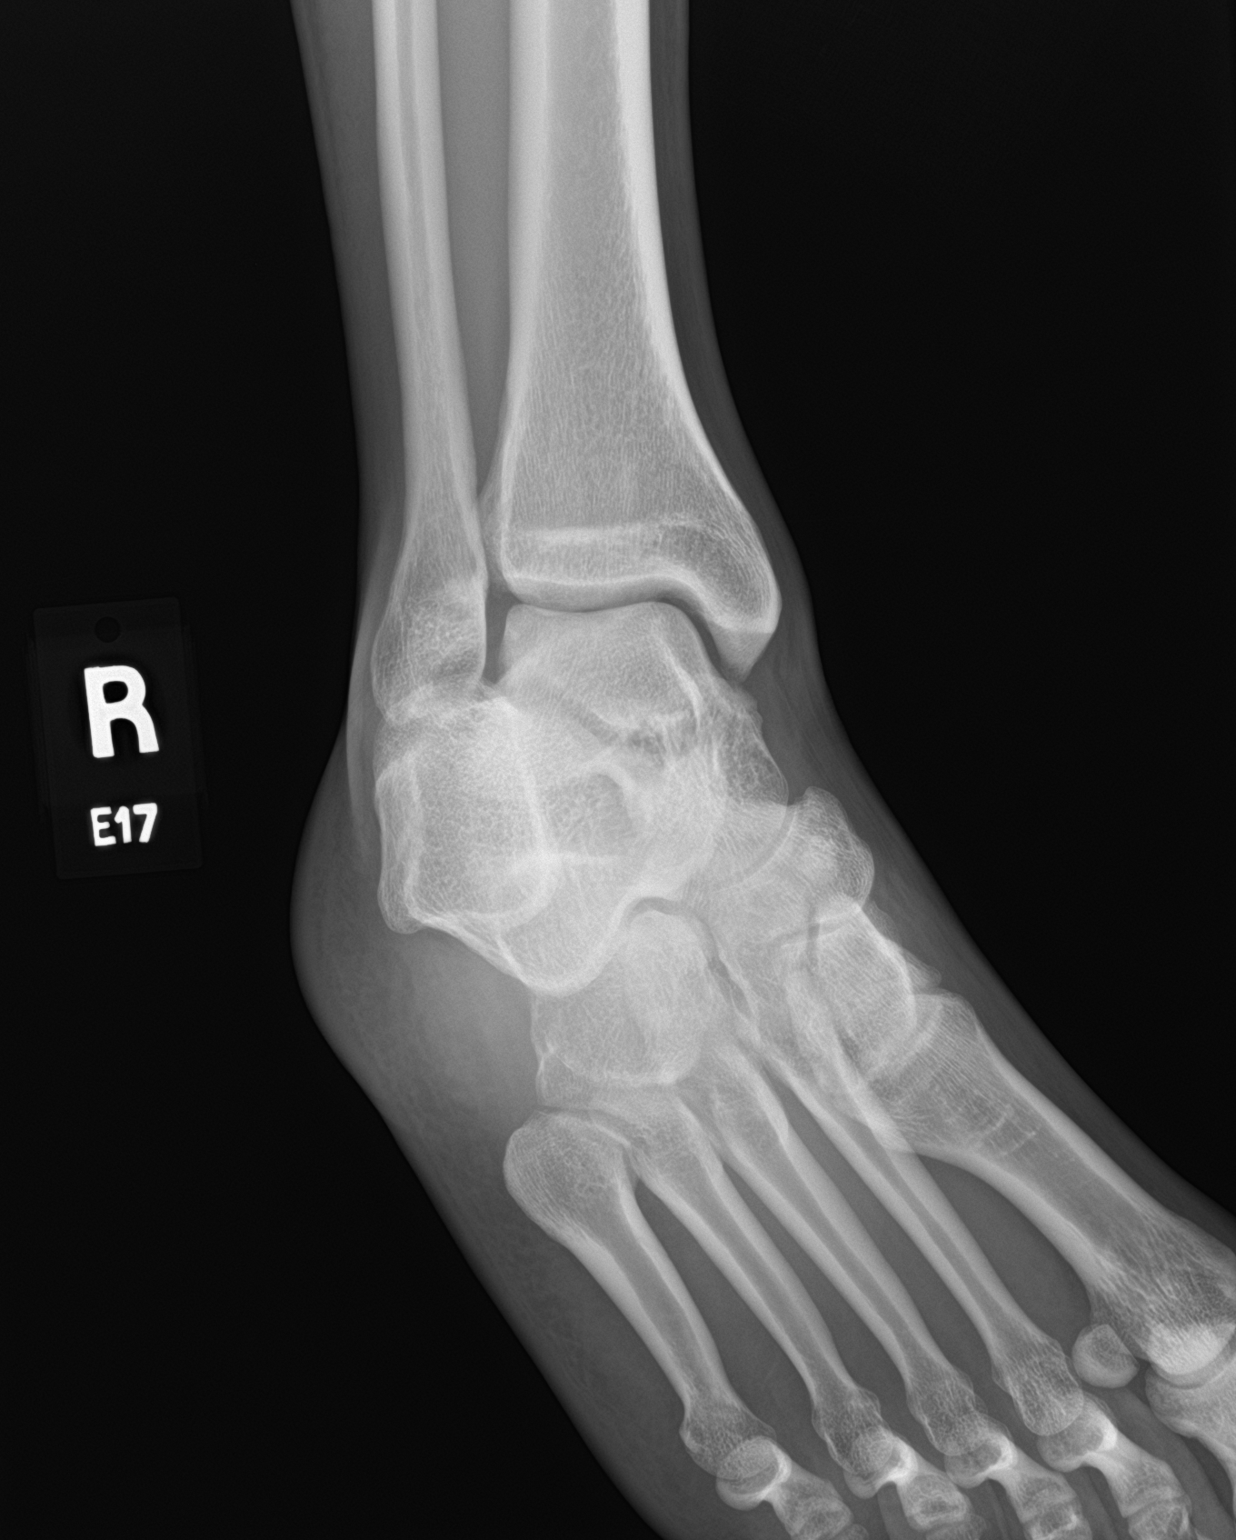

[ankle lat]
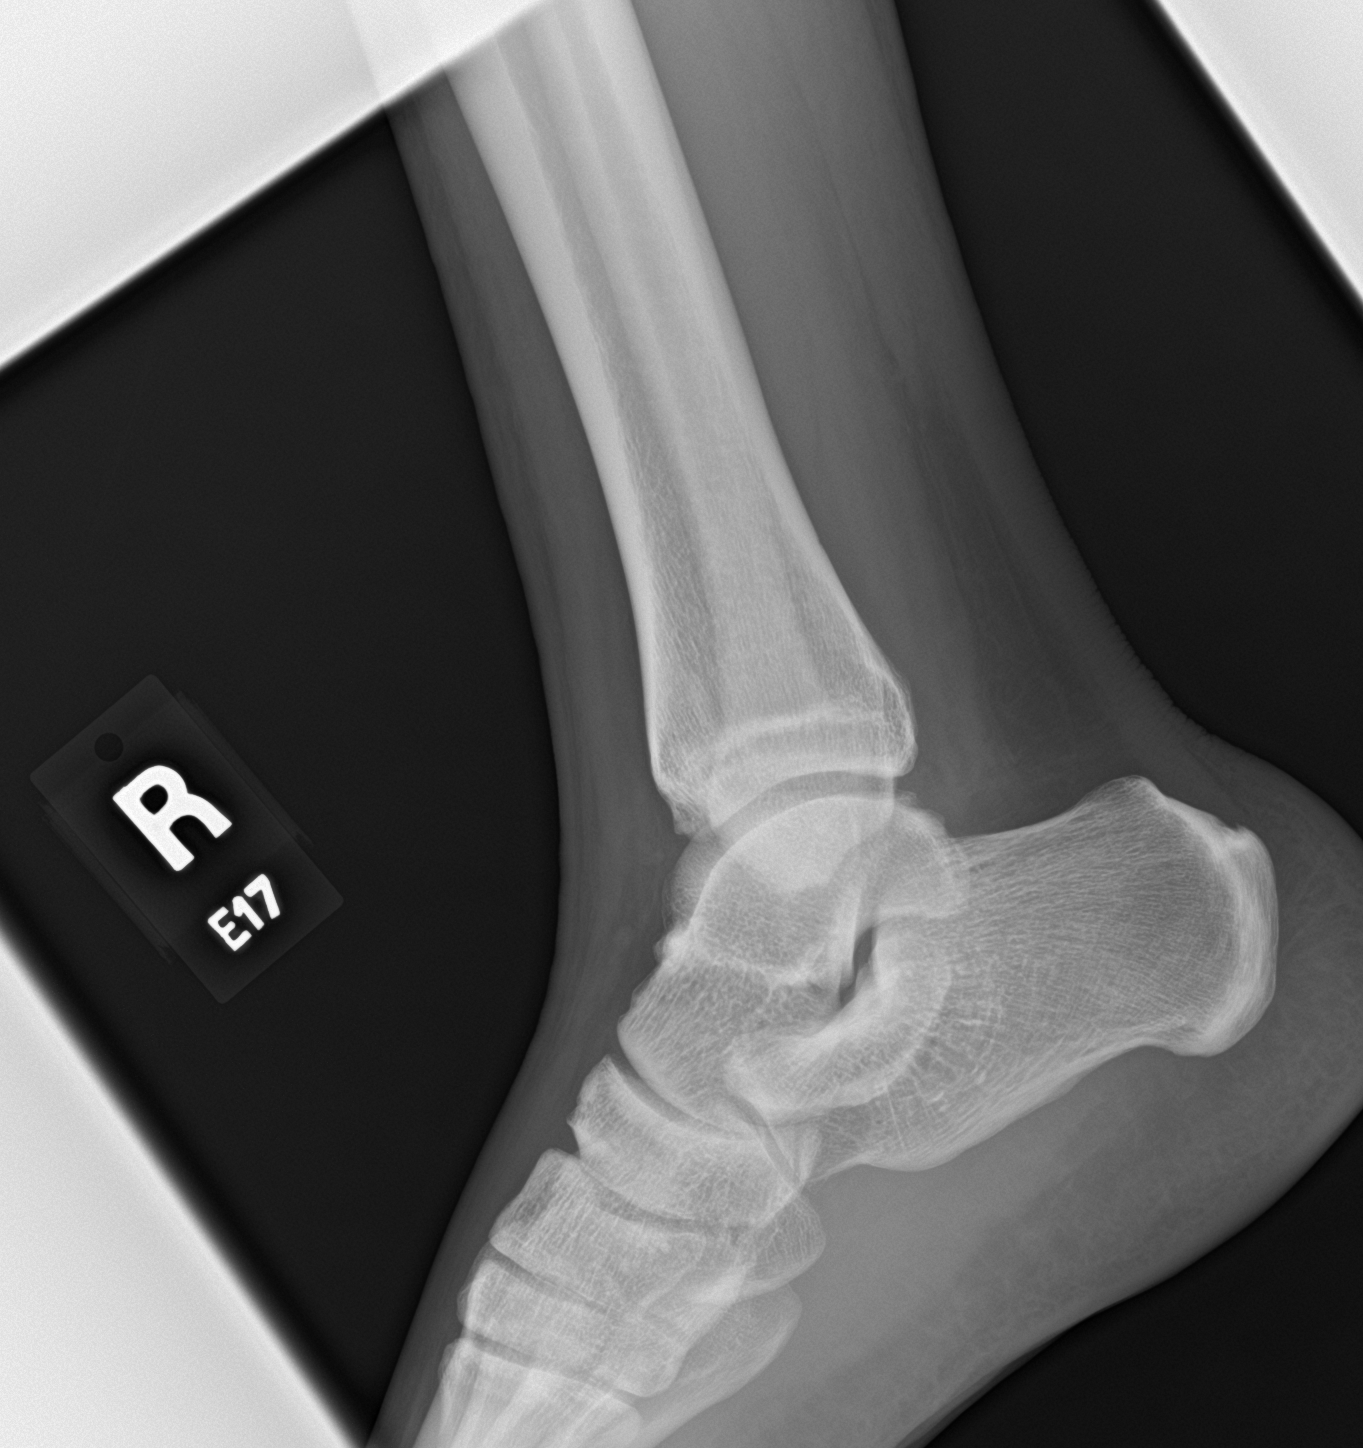

[3 of 3 positions shown; findings below may reference images not displayed]

FINDINGS: Right ankle: There is no evidence of fracture, dislocation, or joint
effusion. There is no evidence of arthropathy or other focal bone
abnormality. Soft tissues are unremarkable.

Right tibia/fibula: There is no evidence of fracture, dislocation,
or joint effusion. There is no evidence of arthropathy or other
focal bone abnormality. Soft tissues are unremarkable.
IMPRESSION: Negative right ankle and right tibia/fibula.

## 2022-06-23 DIAGNOSIS — Z6823 Body mass index (BMI) 23.0-23.9, adult: Secondary | ICD-10-CM | POA: Diagnosis not present

## 2022-06-23 DIAGNOSIS — R070 Pain in throat: Secondary | ICD-10-CM | POA: Diagnosis not present

## 2022-06-23 DIAGNOSIS — J020 Streptococcal pharyngitis: Secondary | ICD-10-CM | POA: Diagnosis not present

## 2022-08-04 NOTE — Progress Notes (Unsigned)
    Brief Narrative   Patient ID: Shawn Cooke, male    DOB: 1996-05-25, 26 y.o.   MRN: [id number]  Mr. Schauer is a 26 y/o AA male with HIV disease diagnosed in August 2020 with risk factor of MSM. Initial CD4 count 796 with viral load 232,000. Genosure with no significant medication resistant mutations. [id number] negative. No history of opportunistic infection. Entered care at [facility] Stage 1. ART treatment experience with Biktarvy.   Subjective:    No chief complaint on file.   HPI:  Shawn Cooke is a 26 y.o. male with HIV disease last seen on 06/12/21 with less than optimal adherence and good tolerance to [place]. Last viral load was undetectable and CD4 count 715. Out of care since last office visit and returns today for follow up.    No Known Allergies    Outpatient Medications Prior to Visit  Medication Sig Dispense Refill   BIKTARVY 50-200-25 MG TABS tablet TAKE 1 TABLET BY MOUTH DAILY 30 tablet 0   citalopram (CELEXA) 40 MG tablet Take 1 tablet (40 mg total) by mouth daily. 90 tablet 1   No facility-administered medications prior to visit.     Past Medical History:  Diagnosis Date   HIV infection ([place])    Influenza      Past Surgical History:  Procedure Laterality Date   NO PAST SURGERIES        Review of Systems    Objective:    There were no vitals taken for this visit. Nursing note and vital signs reviewed.  Physical Exam      05/21/2022   10:45 AM 04/02/2022   11:59 AM 03/02/2022   10:11 AM 02/16/2022    1:15 PM 11/24/2021    8:10 AM  Depression screen PHQ 2/9  Decreased Interest 1 2 2 3  0  Down, Depressed, Hopeless 1 2 2 3  0  PHQ - 2 Score 2 4 4 6  0  Altered sleeping 1 2 2 2  0  Tired, decreased energy 1 2 2 3  0  Change in appetite 0 3 3 1  0  Feeling bad or failure about yourself  1 2 2 3  0  Trouble concentrating 0 1 2 2  0  Moving slowly or fidgety/restless 0 1 2 3  0  Suicidal thoughts 0 1 2 3  0  PHQ-9 Score 5 16 19 23  0  Difficult  doing work/chores Somewhat difficult Somewhat difficult Very difficult Very difficult        Assessment & Plan:    Patient Active Problem List   Diagnosis Date Noted   Healthcare maintenance 08/03/2019   HIV disease ([place]) 07/03/2019   Tobacco use 07/03/2019   Anxiety-like symptoms 09/29/2017   Major depressive disorder, single episode, severe ([place]) 11/05/2016     Problem List Items Addressed This Visit   None    I am having [name] maintain his Biktarvy and citalopram.   No orders of the defined types were placed in this encounter.    Follow-up: No follow-ups on file.   Terri Piedra, MSN, FNP-C Nurse Practitioner [facility] [place] number: [phone]

## 2022-08-05 ENCOUNTER — Other Ambulatory Visit: Payer: Self-pay | Admitting: Family

## 2022-08-05 ENCOUNTER — Encounter: Payer: Self-pay | Admitting: Family

## 2022-08-05 ENCOUNTER — Ambulatory Visit (INDEPENDENT_AMBULATORY_CARE_PROVIDER_SITE_OTHER): Payer: BC Managed Care – PPO | Admitting: Family

## 2022-08-05 ENCOUNTER — Other Ambulatory Visit: Payer: Self-pay

## 2022-08-05 VITALS — BP 138/86 | HR 90 | Temp 97.9°F | Ht 68.0 in | Wt 158.0 lb

## 2022-08-05 DIAGNOSIS — B20 Human immunodeficiency virus [HIV] disease: Secondary | ICD-10-CM

## 2022-08-05 DIAGNOSIS — Z720 Tobacco use: Secondary | ICD-10-CM

## 2022-08-05 DIAGNOSIS — Z0000 Encounter for general adult medical examination without abnormal findings: Secondary | ICD-10-CM

## 2022-08-05 DIAGNOSIS — Z113 Encounter for screening for infections with a predominantly sexual mode of transmission: Secondary | ICD-10-CM

## 2022-08-05 MED ORDER — BIKTARVY 50-200-25 MG PO TABS: 1.0000 | ORAL_TABLET | Freq: Every day | ORAL | 4 refills | Status: DC

## 2022-08-05 NOTE — Assessment & Plan Note (Signed)
Shawn Cooke continues to smoke about 0.25 packs of cigarettes per day and is in the pre-contemplation stage of change and not ready to quit. Recommended cessation to reduce risk of cardiovascular, respiratory, and malignant disease in the future.

## 2022-08-05 NOTE — Assessment & Plan Note (Signed)
[place] has been off medication for about 3 weeks secondary to being busy with work. Discussed importance of taking medication as prescribed to reduce risk of disease progression or development of complications. Check lab work today. Continue current dose of BIktarvy. Plan for follow up in 1 month or sooner if needed with lab work on the same day.

## 2022-08-05 NOTE — Assessment & Plan Note (Signed)
   Discussed importance of safe sexual practice, family planning and condom use. Condoms and STD testing offered.   Routine dental care up to date per recommendations.  Declines vaccinations.

## 2022-08-05 NOTE — Patient Instructions (Addendum)
Nice to see you.  We will check your lab work today.  Continue to take your medication daily as prescribed.  Refills have been sent to the pharmacy.  Plan for follow up in 1 months or sooner if needed with lab work on the same day.  Have a great day and stay safe!  

## 2022-08-06 ENCOUNTER — Other Ambulatory Visit: Payer: Self-pay

## 2022-08-06 DIAGNOSIS — B20 Human immunodeficiency virus [HIV] disease: Secondary | ICD-10-CM

## 2022-08-06 LAB — T-HELPER CELL (CD4) - (RCID CLINIC ONLY)
CD4 % Helper T Cell: 39 % (ref 33–65)
CD4 T Cell Abs: 963 /uL (ref 400–1790)

## 2022-08-06 MED ORDER — BIKTARVY 50-200-25 MG PO TABS: 1.0000 | ORAL_TABLET | Freq: Every day | ORAL | 4 refills | Status: DC

## 2022-08-14 LAB — HIV-1 INTEGRASE GENOTYPE

## 2022-08-14 LAB — COMPREHENSIVE METABOLIC PANEL
AG Ratio: 1.5 (calc) (ref 1.0–2.5)
ALT: 15 U/L (ref 9–46)
AST: 16 U/L (ref 10–40)
Albumin: 4.6 g/dL (ref 3.6–5.1)
Alkaline phosphatase (APISO): 40 U/L (ref 36–130)
BUN: 12 mg/dL (ref 7–25)
CO2: 28 mmol/L (ref 20–32)
Calcium: 9.7 mg/dL (ref 8.6–10.3)
Chloride: 105 mmol/L (ref 98–110)
Creat: 0.91 mg/dL (ref 0.60–1.24)
Globulin: 3.1 g/dL (calc) (ref 1.9–3.7)
Glucose, Bld: 97 mg/dL (ref 65–99)
Potassium: 4.5 mmol/L (ref 3.5–5.3)
Sodium: 141 mmol/L (ref 135–146)
Total Bilirubin: 0.7 mg/dL (ref 0.2–1.2)
Total Protein: 7.7 g/dL (ref 6.1–8.1)

## 2022-08-14 LAB — RPR: RPR Ser Ql: NONREACTIVE

## 2022-08-14 LAB — HIV-1 GENOTYPE: HIV-1 Genotype: DETECTED — AB

## 2022-08-14 LAB — HIV RNA, RTPCR W/R GT (RTI, PI,INT)
HIV 1 RNA Quant: 2280 copies/mL — ABNORMAL HIGH
HIV-1 RNA Quant, Log: 3.36 Log copies/mL — ABNORMAL HIGH

## 2022-09-09 ENCOUNTER — Ambulatory Visit: Payer: BC Managed Care – PPO | Admitting: Family

## 2022-09-14 ENCOUNTER — Telehealth: Payer: Self-pay

## 2022-09-14 NOTE — Telephone Encounter (Signed)
Called patient to reschedule missed appointment, no answer. Left HIPAA compliant voicemail requesting callback.   Sandie Ano, RN

## 2022-09-29 ENCOUNTER — Ambulatory Visit: Payer: BC Managed Care – PPO | Admitting: Family

## 2022-10-24 ENCOUNTER — Other Ambulatory Visit: Payer: Self-pay | Admitting: Family

## 2022-10-24 DIAGNOSIS — B20 Human immunodeficiency virus [HIV] disease: Secondary | ICD-10-CM

## 2022-10-28 NOTE — Telephone Encounter (Signed)
Pt has appt with Tammy Sours on 12/28 @9 :30

## 2022-10-29 ENCOUNTER — Ambulatory Visit: Payer: BC Managed Care – PPO | Admitting: Family

## 2023-02-23 ENCOUNTER — Other Ambulatory Visit: Payer: Self-pay

## 2023-02-23 ENCOUNTER — Other Ambulatory Visit (HOSPITAL_COMMUNITY)
Admission: RE | Admit: 2023-02-23 | Discharge: 2023-02-23 | Disposition: A | Discharge status: Home / Self Care | Payer: Self-pay | Source: Ambulatory Visit | Attending: Family | Admitting: Family

## 2023-02-23 DIAGNOSIS — Z79899 Other long term (current) drug therapy: Secondary | ICD-10-CM

## 2023-02-23 DIAGNOSIS — B20 Human immunodeficiency virus [HIV] disease: Secondary | ICD-10-CM

## 2023-02-23 DIAGNOSIS — Z113 Encounter for screening for infections with a predominantly sexual mode of transmission: Secondary | ICD-10-CM

## 2023-02-24 ENCOUNTER — Other Ambulatory Visit: Payer: Self-pay

## 2023-02-24 LAB — T-HELPER CELL (CD4) - (RCID CLINIC ONLY)
CD4 % Helper T Cell: 39 % (ref 33–65)
CD4 T Cell Abs: 715 /uL (ref 400–1790)

## 2023-02-24 LAB — URINE CYTOLOGY ANCILLARY ONLY
Chlamydia: NEGATIVE
Comment: NEGATIVE
Comment: NORMAL
Neisseria Gonorrhea: NEGATIVE

## 2023-02-26 LAB — CBC WITH DIFFERENTIAL/PLATELET
Absolute Monocytes: 180 cells/uL — ABNORMAL LOW (ref 200–950)
Basophils Absolute: 22 cells/uL (ref 0–200)
Basophils Relative: 0.6 %
Eosinophils Absolute: 90 cells/uL (ref 15–500)
Eosinophils Relative: 2.5 %
HCT: 42.5 % (ref 38.5–50.0)
Hemoglobin: 14.4 g/dL (ref 13.2–17.1)
Lymphs Abs: 1955 cells/uL (ref 850–3900)
MCH: 29.2 pg (ref 27.0–33.0)
MCHC: 33.9 g/dL (ref 32.0–36.0)
MCV: 86.2 fL (ref 80.0–100.0)
MPV: 10 fL (ref 7.5–12.5)
Monocytes Relative: 5 %
Neutro Abs: 1354 cells/uL — ABNORMAL LOW (ref 1500–7800)
Neutrophils Relative %: 37.6 %
Platelets: 293 10*3/uL (ref 140–400)
RBC: 4.93 10*6/uL (ref 4.20–5.80)
RDW: 11.9 % (ref 11.0–15.0)
Total Lymphocyte: 54.3 %
WBC: 3.6 10*3/uL — ABNORMAL LOW (ref 3.8–10.8)

## 2023-02-26 LAB — RPR: RPR Ser Ql: NONREACTIVE

## 2023-02-26 LAB — COMPLETE METABOLIC PANEL WITH GFR
AG Ratio: 1.7 (calc) (ref 1.0–2.5)
ALT: 16 U/L (ref 9–46)
AST: 18 U/L (ref 10–40)
Albumin: 4.7 g/dL (ref 3.6–5.1)
Alkaline phosphatase (APISO): 36 U/L (ref 36–130)
BUN: 14 mg/dL (ref 7–25)
CO2: 29 mmol/L (ref 20–32)
Calcium: 9.5 mg/dL (ref 8.6–10.3)
Chloride: 103 mmol/L (ref 98–110)
Creat: 0.96 mg/dL (ref 0.60–1.24)
Globulin: 2.8 g/dL (calc) (ref 1.9–3.7)
Glucose, Bld: 119 mg/dL — ABNORMAL HIGH (ref 65–99)
Potassium: 4.1 mmol/L (ref 3.5–5.3)
Sodium: 139 mmol/L (ref 135–146)
Total Bilirubin: 0.8 mg/dL (ref 0.2–1.2)
Total Protein: 7.5 g/dL (ref 6.1–8.1)
eGFR: 112 mL/min/{1.73_m2} (ref >=60)

## 2023-02-26 LAB — LIPID PANEL
Cholesterol: 102 mg/dL (ref <200)
HDL: 40 mg/dL (ref >=40)
LDL Cholesterol (Calc): 48 mg/dL (calc)
Non-HDL Cholesterol (Calc): 62 mg/dL (calc) (ref <130)
Total CHOL/HDL Ratio: 2.6 (calc) (ref <5.0)
Triglycerides: 56 mg/dL (ref <150)

## 2023-02-26 LAB — HIV-1 RNA QUANT-NO REFLEX-BLD
HIV 1 RNA Quant: 394 Copies/mL — ABNORMAL HIGH
HIV-1 RNA Quant, Log: 2.6 Log cps/mL — ABNORMAL HIGH

## 2023-03-15 ENCOUNTER — Telehealth: Payer: Self-pay

## 2023-03-15 NOTE — Telephone Encounter (Signed)
Detectable Viral Load Intervention   Most recent VL:  HIV 1 RNA Quant  Date Value Ref Range Status  02/23/2023 394 (H) Copies/mL Final  08/05/2022 2,280 (H) copies/mL Final  04/08/2021 Not Detected Copies/mL Final    Current ART regimen: Biktarvy  Appointment status: patient has future appointment scheduled  Called patient to discuss medication adherence and possible barriers to care.   Medication last dispensed (per chart review):   Dispensed Days Supply Quantity Provider Pharmacy   BIKTARVY  50-200-25 MG TABS 08/30/2022 90 90 tablet Veryl Speak, FNP [facility]    Medication Adherence   Unable to assess.    Barriers to Care  Unable to assess.    Interventions: Called patient to remind him about tomorrow's appointment, no answer. Left HIPAA compliant voicemail requesting callback.   Sandie Ano, RN

## 2023-03-16 ENCOUNTER — Encounter: Payer: Self-pay | Admitting: Family

## 2023-03-16 ENCOUNTER — Other Ambulatory Visit: Payer: Self-pay

## 2023-03-16 ENCOUNTER — Ambulatory Visit (INDEPENDENT_AMBULATORY_CARE_PROVIDER_SITE_OTHER): Payer: Self-pay | Admitting: Family

## 2023-03-16 VITALS — BP 127/84 | HR 82 | Temp 98.2°F | Wt 171.6 lb

## 2023-03-16 DIAGNOSIS — R7309 Other abnormal glucose: Secondary | ICD-10-CM

## 2023-03-16 DIAGNOSIS — Z720 Tobacco use: Secondary | ICD-10-CM

## 2023-03-16 DIAGNOSIS — B20 Human immunodeficiency virus [HIV] disease: Secondary | ICD-10-CM

## 2023-03-16 DIAGNOSIS — Z0000 Encounter for general adult medical examination without abnormal findings: Secondary | ICD-10-CM

## 2023-03-16 NOTE — Progress Notes (Unsigned)
Brief Narrative   Patient ID: Shawn Cooke, male    DOB: April 09, 1996, 27 y.o.   MRN: [id number]  Mr. Shawn Cooke is a 27 y/o AA male with HIV disease diagnosed in August 2020 with risk factor of MSM. Initial CD4 count 796 with viral load 232,000. Genosure with no significant medication resistant mutations. [id number] negative. No history of opportunistic infection. Entered care at [facility] Stage 1. ART treatment experience with Biktarvy.   Subjective:    Chief Complaint  Patient presents with   Follow-up    B20     HPI:  Shawn Cooke is a 27 y.o. male with HIV disease last seen on 08/05/22 with less than optimally controlled virus and good tolerance to [place]. Viral load was 2280 with CD4 count 963. Kidney function, hepatic function and electrolytes within normal ranges. Most recent lab work with viral load 394 and CD4 count 715. RPR non-reactive and STD testing negative for gonorrhea and chlamydia. Here today for follow up.   Shawn Cooke has been doing okay since his last office visit and found out that he is going to be a father. Partner is HIV negative and has been on PrEP for the past 4 months. Shawn Cooke has been off medication for about 2 months and restarted taking it about 6 weeks ago and now has more motivation to take medication regularly. Has questions about baby and HIV. Condoms and STD testing offered. Due for routine dental care. Declines vaccinations.  Denies fevers, chills, night sweats, headaches, changes in vision, neck pain/stiffness, nausea, diarrhea, vomiting, lesions or rashes.   No Known Allergies    Outpatient Medications Prior to Visit  Medication Sig Dispense Refill   BIKTARVY 50-200-25 MG TABS tablet Take 1 tablet by mouth daily. 30 tablet 4   amoxicillin (AMOXIL) 500 MG capsule Take 500 mg by mouth 3 (three) times daily. (Patient not taking: Reported on 03/16/2023)     No facility-administered medications prior to visit.     Past Medical History:   Diagnosis Date   HIV infection (HCC)    Influenza      Past Surgical History:  Procedure Laterality Date   NO PAST SURGERIES        Review of Systems  Constitutional:  Negative for appetite change, chills, fatigue, fever and unexpected weight change.  Eyes:  Negative for visual disturbance.  Respiratory:  Negative for cough, chest tightness, shortness of breath and wheezing.   Cardiovascular:  Negative for chest pain and leg swelling.  Gastrointestinal:  Negative for abdominal pain, constipation, diarrhea, nausea and vomiting.  Genitourinary:  Negative for dysuria, flank pain, frequency, genital sores, hematuria and urgency.  Skin:  Negative for rash.  Allergic/Immunologic: Negative for immunocompromised state.  Neurological:  Negative for dizziness and headaches.      Objective:    BP 127/84   Pulse 82   Temp 98.2 F (36.8 C) (Oral)   Wt 171 lb 9.6 oz (77.8 kg)   SpO2 99%   BMI 26.09 kg/m  Nursing note and vital signs reviewed.  Physical Exam Constitutional:      General: He is not in acute distress.    Appearance: He is well-developed.  Eyes:     Conjunctiva/sclera: Conjunctivae normal.  Cardiovascular:     Rate and Rhythm: Normal rate and regular rhythm.     Heart sounds: Normal heart sounds. No murmur heard.    No friction rub. No gallop.  Pulmonary:     Effort: Pulmonary effort is normal.  No respiratory distress.     Breath sounds: Normal breath sounds. No wheezing or rales.  Chest:     Chest wall: No tenderness.  Abdominal:     General: Bowel sounds are normal.     Palpations: Abdomen is soft.     Tenderness: There is no abdominal tenderness.  Musculoskeletal:     Cervical back: Neck supple.  Lymphadenopathy:     Cervical: No cervical adenopathy.  Skin:    General: Skin is warm and dry.     Findings: No rash.  Neurological:     Mental Status: He is alert and oriented to person, place, and time.  Psychiatric:        Behavior: Behavior normal.         Thought Content: Thought content normal.        Judgment: Judgment normal.         03/16/2023    3:32 PM 08/05/2022    4:10 PM 05/21/2022   10:45 AM 04/02/2022   11:59 AM 03/02/2022   10:11 AM  Depression screen PHQ 2/9  Decreased Interest 0 0 1 2 2   Down, Depressed, Hopeless 1 0 1 2 2   PHQ - 2 Score 1 0 2 4 4   Altered sleeping   1 2 2   Tired, decreased energy   1 2 2   Change in appetite   0 3 3  Feeling bad or failure about yourself    1 2 2   Trouble concentrating   0 1 2  Moving slowly or fidgety/restless   0 1 2  Suicidal thoughts   0 1 2  PHQ-9 Score   5 16 19   Difficult doing work/chores   Somewhat difficult Somewhat difficult Very difficult       Assessment & Plan:    Patient Active Problem List   Diagnosis Date Noted   Elevated glucose 03/17/2023   Healthcare maintenance 08/03/2019   HIV disease (HCC) 07/03/2019   Tobacco use 07/03/2019   Anxiety-like symptoms 09/29/2017   Major depressive disorder, single episode, severe (HCC) 11/05/2016     Problem List Items Addressed This Visit       Other   HIV disease (HCC) - Primary    Basim has improved viral control with restarting medication after being off for at least 2 months. Tolerating Biktarvy with no adverse side effects. Reviewed lab work, discussed U=U and answered questions related to HIV and family planning. Continue current dose of Biktarvy. Reminded of importance of taking medication daily and avoid lapses. Working with financial team for coverage and was recently removed from parents insurance. Has about 1-2 months of medication available and will send in prescription once coverage is determined. Plan for follow up in 3 month or sooner if needed with lab work 1-2 weeks prior to appointment.       Relevant Orders   BASIC METABOLIC PANEL WITH GFR   HIV-1 RNA quant-no reflex-bld   T-helper cell (CD4)- ([facility] clinic only)   Tobacco use    Congratulated on quitting smoking about 1 month ago. Discussed  resources available to maintain abstinence.       Healthcare maintenance    Discussed importance of safe sexual practice and condom use. Condoms and STD testing offered.  Declines vaccines.       Elevated glucose    Acheron had an elevated glucose. Will check A1c to screen for diabetes although typically has been well controlled and no current symptoms.  Relevant Orders   HgB A1c     I am having [name] maintain his amoxicillin and Biktarvy.   Follow-up: Return in about 3 months (around 06/16/2023).   Marcos Eke, MSN, FNP-C Nurse Practitioner [facility] for Infectious Disease [facility] Main number: [phone]

## 2023-03-16 NOTE — Patient Instructions (Addendum)
Nice to see you.  Continue to take your medication daily as prescribed.  Follow up with our financial team for coverage.   Plan for follow up in 3 months or sooner if needed with lab work 1-2 weeks prior to appointment.   Have a great day and stay safe!

## 2023-03-17 ENCOUNTER — Telehealth: Payer: Self-pay

## 2023-03-17 ENCOUNTER — Other Ambulatory Visit (HOSPITAL_COMMUNITY): Payer: Self-pay

## 2023-03-17 ENCOUNTER — Other Ambulatory Visit: Payer: Self-pay

## 2023-03-17 ENCOUNTER — Encounter: Payer: Self-pay | Admitting: Family

## 2023-03-17 DIAGNOSIS — R7309 Other abnormal glucose: Secondary | ICD-10-CM | POA: Insufficient documentation

## 2023-03-17 MED ORDER — BIKTARVY 50-200-25 MG PO TABS
1.0000 | ORAL_TABLET | Freq: Every day | ORAL | 0 refills | Status: DC
  Filled 2023-03-24: qty 30, 30d supply, fill #0

## 2023-03-17 NOTE — Telephone Encounter (Signed)
[facility] Patient Advocate Encounter  Completed and sent [employer] Advancing Access application for Biktarvy for this patient who is uninsured.    Patient is approved 03/17/23 through 03/16/24.            Clearance Coots, CPhT Specialty Pharmacy Patient [facility] for Infectious Disease Phone: [phone] Fax:  [phone]

## 2023-03-17 NOTE — Assessment & Plan Note (Signed)
Congratulated on quitting smoking about 1 month ago. Discussed resources available to maintain abstinence.

## 2023-03-17 NOTE — Assessment & Plan Note (Addendum)
Shawn Cooke has improved viral control with restarting medication after being off for at least 2 months. Tolerating Biktarvy with no adverse side effects. Reviewed lab work, discussed U=U and answered questions related to HIV and family planning. Continue current dose of Biktarvy. Reminded of importance of taking medication daily and avoid lapses. Working with financial team for coverage and was recently removed from parents insurance. Has about 1-2 months of medication available and will send in prescription once coverage is determined. Plan for follow up in 3 month or sooner if needed with lab work 1-2 weeks prior to appointment.

## 2023-03-17 NOTE — Assessment & Plan Note (Signed)
Discussed importance of safe sexual practice and condom use. Condoms and STD testing offered.  Declines vaccines.  

## 2023-03-17 NOTE — Assessment & Plan Note (Signed)
Shawn Cooke had an elevated glucose. Will check A1c to screen for diabetes although typically has been well controlled and no current symptoms.

## 2023-03-24 ENCOUNTER — Other Ambulatory Visit (HOSPITAL_COMMUNITY): Payer: Self-pay

## 2023-03-24 ENCOUNTER — Other Ambulatory Visit: Payer: Self-pay

## 2023-04-14 ENCOUNTER — Other Ambulatory Visit (HOSPITAL_COMMUNITY): Payer: Self-pay

## 2023-04-16 ENCOUNTER — Other Ambulatory Visit (HOSPITAL_COMMUNITY): Payer: Self-pay

## 2023-04-16 ENCOUNTER — Other Ambulatory Visit: Payer: Self-pay | Admitting: Pharmacist

## 2023-04-16 ENCOUNTER — Other Ambulatory Visit: Payer: Self-pay

## 2023-04-16 MED FILL — Bictegravir-Emtricitabine-Tenofovir AF Tab 50-200-25 MG: ORAL | 30 days supply | Qty: 30 | Fill #0 | Status: AC

## 2023-04-19 ENCOUNTER — Other Ambulatory Visit (HOSPITAL_COMMUNITY): Payer: Self-pay

## 2023-05-19 ENCOUNTER — Other Ambulatory Visit (HOSPITAL_COMMUNITY): Payer: Self-pay

## 2023-05-19 MED FILL — Bictegravir-Emtricitabine-Tenofovir AF Tab 50-200-25 MG: ORAL | 30 days supply | Qty: 30 | Fill #1 | Status: AC

## 2023-05-24 ENCOUNTER — Other Ambulatory Visit (HOSPITAL_COMMUNITY): Payer: Self-pay

## 2023-06-15 ENCOUNTER — Other Ambulatory Visit: Payer: Self-pay

## 2023-06-15 MED FILL — Bictegravir-Emtricitabine-Tenofovir AF Tab 50-200-25 MG: ORAL | 30 days supply | Qty: 30 | Fill #2 | Status: AC

## 2023-06-16 ENCOUNTER — Ambulatory Visit: Payer: Self-pay | Admitting: Family

## 2023-06-18 ENCOUNTER — Other Ambulatory Visit (HOSPITAL_COMMUNITY): Payer: Self-pay

## 2023-07-09 ENCOUNTER — Other Ambulatory Visit (HOSPITAL_COMMUNITY): Payer: Self-pay

## 2023-07-14 ENCOUNTER — Other Ambulatory Visit (HOSPITAL_COMMUNITY): Payer: Self-pay

## 2023-07-16 ENCOUNTER — Encounter (HOSPITAL_COMMUNITY): Payer: Self-pay

## 2023-07-16 ENCOUNTER — Other Ambulatory Visit (HOSPITAL_COMMUNITY): Payer: Self-pay

## 2023-07-21 ENCOUNTER — Other Ambulatory Visit (HOSPITAL_COMMUNITY): Payer: Self-pay

## 2023-07-21 MED FILL — Bictegravir-Emtricitabine-Tenofovir AF Tab 50-200-25 MG: ORAL | 30 days supply | Qty: 30 | Fill #3 | Status: AC

## 2023-07-22 ENCOUNTER — Other Ambulatory Visit (HOSPITAL_COMMUNITY): Payer: Self-pay

## 2023-08-11 ENCOUNTER — Other Ambulatory Visit: Payer: Self-pay

## 2023-08-13 ENCOUNTER — Other Ambulatory Visit: Payer: Self-pay

## 2023-08-16 ENCOUNTER — Other Ambulatory Visit: Payer: Self-pay

## 2023-08-16 MED FILL — Bictegravir-Emtricitabine-Tenofovir AF Tab 50-200-25 MG: ORAL | 30 days supply | Qty: 30 | Fill #4 | Status: AC

## 2023-08-16 NOTE — Progress Notes (Signed)
Specialty Pharmacy Refill Coordination Note  Shawn Cooke is a 27 y.o. male contacted today regarding refills of specialty medication(s) Bictegravir-Emtricitab-Tenofov   Patient requested Delivery   Delivery date: 08/20/23   Verified address: Patient address [address]  [place] [address]   Medication will be filled on 08/19/23.

## 2023-08-19 ENCOUNTER — Other Ambulatory Visit: Payer: Self-pay

## 2023-09-09 ENCOUNTER — Other Ambulatory Visit: Payer: Self-pay

## 2023-09-13 ENCOUNTER — Other Ambulatory Visit: Payer: Self-pay

## 2023-09-15 ENCOUNTER — Other Ambulatory Visit: Payer: Self-pay

## 2023-10-07 ENCOUNTER — Other Ambulatory Visit: Payer: Self-pay

## 2023-11-17 ENCOUNTER — Other Ambulatory Visit: Payer: Self-pay

## 2023-11-19 ENCOUNTER — Other Ambulatory Visit: Payer: Self-pay | Admitting: Pharmacist

## 2023-11-19 NOTE — Progress Notes (Signed)
Call center and clinic have been able to reach patient via phone call or MyChart for Biktarvy refills or clinic appointments. Last met with Tammy Sours in May 2024 and last filled Biktarvy in October 2024. Disenrolling in clinical services.  Margarite Gouge, PharmD, CPP, BCIDP, AAHIVP Clinical Pharmacist Practitioner Infectious Diseases Clinical Pharmacist [facility] for Infectious Disease

## 2024-01-27 ENCOUNTER — Other Ambulatory Visit (HOSPITAL_COMMUNITY): Payer: Self-pay

## 2024-09-13 ENCOUNTER — Telehealth: Payer: Self-pay

## 2024-09-13 NOTE — Telephone Encounter (Signed)
 Attempt to Re-Engage in Care  No office visit or HIV labs completed at [facility] within the last 12 months. Patient is considered out of care.   Last [facility] Visit: 03/16/23  Last HIV Viral Load:  HIV 1 RNA Quant  Date Value Ref Range Status  02/23/2023 394 (H) Copies/mL Final    Last CD4 Count:  CD4 T Cell Abs  Date Value Ref Range Status  02/23/2023 715 400 - 1,790 /uL Final    Medication Dispense History:   Dispensed Days Supply Quantity Provider Pharmacy  bictegravir-emtricitabine -tenofovir  AF (BIKTARVY ) 50-200-25 MG TABS tablet 08/19/2023 30 30 tablet Calone, Gregory D, FNP [facility] [facility]  bictegravir-emtricitabine -tenofovir  AF (BIKTARVY ) 50-200-25 MG TABS tablet 07/22/2023 30 30 tablet Calone, Gregory D, FNP [place] [facility]    Interventions: Called Dawon, no answer. Left HIPAA compliant voicemail requesting callback. Called alternate number in chart, patient's mother answered. She states patient is not available right now but will ask him to call back.   Duration of Services: 5 minutes  Laelani Vasko, BSN, [employer]

## 2024-11-17 ENCOUNTER — Other Ambulatory Visit (HOSPITAL_COMMUNITY): Payer: Self-pay

## 2024-11-17 ENCOUNTER — Telehealth: Payer: Self-pay

## 2024-11-17 NOTE — Telephone Encounter (Signed)
 Called and spoke with Shawn Cooke, scheduled appointment for 1/29. He reports insurance issues and states this has impacted his ability to come to appointments. Discussed financial counselor and clinic resources and encouraged him to call with any future concerns so that we can help troubleshoot.   Shanaia Sievers, BSN, RN

## 2024-11-17 NOTE — Telephone Encounter (Signed)
 [facility] Pharmacy Patient Advocate Encounter  Insurance verification completed.    The patient is uninsured and will need patient assistance for medication.  We can complete the application and will need to meet with the patient for signatures and income documentation.

## 2024-11-30 ENCOUNTER — Other Ambulatory Visit: Payer: Self-pay

## 2024-11-30 ENCOUNTER — Other Ambulatory Visit: Payer: Self-pay | Admitting: Pharmacist

## 2024-11-30 ENCOUNTER — Encounter: Payer: Self-pay | Admitting: Family

## 2024-11-30 ENCOUNTER — Ambulatory Visit: Payer: Self-pay | Admitting: Family

## 2024-11-30 ENCOUNTER — Other Ambulatory Visit (HOSPITAL_COMMUNITY): Payer: Self-pay

## 2024-11-30 VITALS — BP 139/86 | HR 95 | Temp 98.1°F | Ht 68.0 in | Wt 172.0 lb

## 2024-11-30 DIAGNOSIS — Z720 Tobacco use: Secondary | ICD-10-CM

## 2024-11-30 DIAGNOSIS — F17210 Nicotine dependence, cigarettes, uncomplicated: Secondary | ICD-10-CM | POA: Diagnosis not present

## 2024-11-30 DIAGNOSIS — B20 Human immunodeficiency virus [HIV] disease: Secondary | ICD-10-CM | POA: Diagnosis not present

## 2024-11-30 DIAGNOSIS — Z91199 Patient's noncompliance with other medical treatment and regimen due to unspecified reason: Secondary | ICD-10-CM

## 2024-11-30 DIAGNOSIS — Z0000 Encounter for general adult medical examination without abnormal findings: Secondary | ICD-10-CM

## 2024-11-30 LAB — COMPREHENSIVE METABOLIC PANEL WITH GFR
AG Ratio: 1.2 (calc) (ref 1.0–2.5)
ALT: 22 U/L (ref 9–46)
AST: 21 U/L (ref 10–40)
Albumin: 4.7 g/dL (ref 3.6–5.1)
Alkaline phosphatase (APISO): 36 U/L (ref 36–130)
BUN: 12 mg/dL (ref 7–25)
CO2: 29 mmol/L (ref 20–32)
Calcium: 9.9 mg/dL (ref 8.6–10.3)
Chloride: 102 mmol/L (ref 98–110)
Creat: 0.87 mg/dL (ref 0.60–1.24)
Globulin: 3.9 g/dL — ABNORMAL HIGH (ref 1.9–3.7)
Glucose, Bld: 80 mg/dL (ref 65–99)
Potassium: 4.4 mmol/L (ref 3.5–5.3)
Sodium: 138 mmol/L (ref 135–146)
Total Bilirubin: 0.8 mg/dL (ref 0.2–1.2)
Total Protein: 8.6 g/dL — ABNORMAL HIGH (ref 6.1–8.1)
eGFR: 121 mL/min/{1.73_m2}

## 2024-11-30 MED ORDER — DOVATO 50-300 MG PO TABS
1.0000 | ORAL_TABLET | Freq: Every day | ORAL | 5 refills | Status: AC
  Filled 2024-11-30: qty 30, 30d supply, fill #0

## 2024-11-30 NOTE — Progress Notes (Signed)
 Specialty Pharmacy Initiation Note   Shawn Cooke is a 29 y.o. male who will be followed by the specialty pharmacy service for RxSp HIV    Review of administration, indication, effectiveness, safety, potential side effects, storage/disposable, and missed dose instructions occurred today for patient's specialty medication(s) Dolutegravir -lamiVUDine  (Dovato )     Patient/Caregiver did not have any additional questions or concerns.   Patient's therapy is appropriate to: Initiate    Goals Addressed             This Visit's Progress    Achieve Undetectable HIV Viral Load < 20       Patient is not on track and no change. Patient will maintain adherence and work on increased adherence      Comply with lab assessments       Patient is not on track and improving. Patient will adhere to provider and/or lab appointments      Maintain optimal adherence to therapy       Patient is not on track and no change. Patient will work on increased adherence         Alan JINNY Geralds Specialty Pharmacist

## 2024-11-30 NOTE — Assessment & Plan Note (Signed)
 Not currently sexually active. Vaccinations are reviewed and declined following counseling Due for routine dental care which he will schedule independently

## 2024-11-30 NOTE — Progress Notes (Signed)
 "   Brief Narrative   Patient ID: Shawn Cooke, male    DOB: 26-Nov-1995, 29 y.o.   MRN: [id number]  Shawn Cooke is a 29 y/o AA male with HIV disease diagnosed in August 2020 with risk factor of MSM. Initial CD4 count 796 with viral load 232,000. Genosure with no significant medication resistant mutations. [id number] negative. No history of opportunistic infection. Entered care at [facility] Stage 1. ART treatment experience with Biktarvy .   Subjective:   Chief Complaint  Patient presents with   Follow-up    HPI:  Shawn Cooke is a 29 y.o. male with HIV disease last seen on 03/16/2023 with improved adherence and good tolerance to Biktarvy .  Viral load was detectable at 394 with CD4 count 715.  Kidney function, liver function, electrolytes within normal ranges.  Last medication refill was October 2024.  Has been out of care since that time.  Here today to reengage in care.  Shawn Cooke has been doing okay since his last office visit and has been off medication secondary to life stressors as well as his son who is now 58-year-old and HIV free.  Currently working full-time with stable housing, transportation, and access to food.  Covered by [employer].  Overall feeling okay with no new concerns/complaints.  Drinks alcohol on occasion with no current recreational/illicit drug use and tobacco every other day.  Not currently sexually active.  Healthcare maintenance reviewed.  Due for routine dental care.  Denies fevers, chills, night sweats, headaches, changes in vision, neck pain/stiffness, nausea, diarrhea, vomiting, lesions or rashes.  Lab Results  Component Value Date   CD4TCELL 39 02/23/2023   CD4TABS 715 02/23/2023   Lab Results  Component Value Date   HIV1RNAQUANT 394 (H) 02/23/2023     Allergies[1]    Outpatient Medications Prior to Visit  Medication Sig Dispense Refill   bictegravir-emtricitabine -tenofovir  AF (BIKTARVY ) 50-200-25 MG TABS tablet Take 1 tablet daily  (Patient not taking: Reported on 11/30/2024) 30 tablet 5   No facility-administered medications prior to visit.     Past Medical History:  Diagnosis Date   HIV infection (HCC)    Influenza      Past Surgical History:  Procedure Laterality Date   NO PAST SURGERIES          Review of Systems  Constitutional:  Negative for appetite change, chills, fatigue, fever and unexpected weight change.  Eyes:  Negative for visual disturbance.  Respiratory:  Negative for cough, chest tightness, shortness of breath and wheezing.   Cardiovascular:  Negative for chest pain and leg swelling.  Gastrointestinal:  Negative for abdominal pain, constipation, diarrhea, nausea and vomiting.  Genitourinary:  Negative for dysuria, flank pain, frequency, genital sores, hematuria and urgency.  Skin:  Negative for rash.  Allergic/Immunologic: Negative for immunocompromised state.  Neurological:  Negative for dizziness and headaches.     Objective:   BP 139/86   Pulse 95   Temp 98.1 F (36.7 C) (Oral)   Ht 5' 8 (1.727 m)   Wt 172 lb (78 kg)   SpO2 99%   BMI 26.15 kg/m  Nursing note and vital signs reviewed.  Physical Exam Constitutional:      General: He is not in acute distress.    Appearance: He is well-developed.  Eyes:     Conjunctiva/sclera: Conjunctivae normal.  Cardiovascular:     Rate and Rhythm: Normal rate and regular rhythm.     Heart sounds: Normal heart sounds. No murmur heard.  No friction rub. No gallop.  Pulmonary:     Effort: Pulmonary effort is normal. No respiratory distress.     Breath sounds: Normal breath sounds. No wheezing or rales.  Chest:     Chest wall: No tenderness.  Abdominal:     General: Bowel sounds are normal.     Palpations: Abdomen is soft.     Tenderness: There is no abdominal tenderness.  Musculoskeletal:     Cervical back: Neck supple.  Lymphadenopathy:     Cervical: No cervical adenopathy.  Skin:    General: Skin is warm and dry.      Findings: No rash.  Neurological:     Mental Status: He is alert and oriented to person, place, and time.          03/16/2023    3:32 PM 08/05/2022    4:10 PM 05/21/2022   10:45 AM 04/02/2022   11:59 AM 03/02/2022   10:11 AM  Depression screen PHQ 2/9  Decreased Interest 0 0 1 2 2   Down, Depressed, Hopeless 1 0 1 2 2   PHQ - 2 Score 1 0 2 4 4   Altered sleeping   1 2 2   Tired, decreased energy   1 2 2   Change in appetite   0 3 3  Feeling bad or failure about yourself    1 2 2   Trouble concentrating   0 1 2  Moving slowly or fidgety/restless   0 1 2  Suicidal thoughts   0 1 2  PHQ-9 Score   5  16  19    Difficult doing work/chores   Somewhat difficult Somewhat difficult Very difficult     Data saved with a previous flowsheet row definition        04/02/2022   12:00 PM 03/02/2022   10:13 AM 02/16/2022    1:16 PM 11/24/2021    8:10 AM  GAD 7 : Generalized Anxiety Score  Nervous, Anxious, on Edge 2  2  3   0   Control/stop worrying 1  2  2   0   Worry too much - different things 1  2  2   0   Trouble relaxing 2  2  3   0   Restless 2  2  3   0   Easily annoyed or irritable 1  1  3   0   Afraid - awful might happen 2  1  2   0   Total GAD 7 Score 11 12 18  0  Anxiety Difficulty Somewhat difficult Very difficult Very difficult      Data saved with a previous flowsheet row definition     The ASCVD Risk score (Arnett DK, et al., 2019) failed to calculate for the following reasons:   The 2019 ASCVD risk score is only valid for ages 88 to 22   * - Cholesterol units were assumed      Assessment & Plan:    Patient Active Problem List   Diagnosis Date Noted   Elevated glucose 03/17/2023   Healthcare maintenance 08/03/2019   HIV disease (HCC) 07/03/2019   Tobacco use 07/03/2019   Anxiety-like symptoms 09/29/2017   Major depressive disorder, single episode, severe (HCC) 11/05/2016     Problem List Items Addressed This Visit       Other   HIV disease (HCC) - Primary   Shawn Cooke  presents today to reengage in care and likely has poorly controlled virus secondary to being off medication for over 1 year.  Fortunately no signs/symptoms of opportunistic infection.  Discussed importance of taking medication and continued follow-up to reduce risk of disease progression and complications in the future.  Covered by [employer] with high deductible with Biktarvy  and will change to Dovato  with lower deductible costs and co-pay card associated with his account.  Sample of Dovato  provided and recorded in pharmacy log.  Check lab work.  Plan for follow-up in 1 month or sooner if needed with lab work on the same day.      Relevant Medications   dolutegravir -lamiVUDine  (DOVATO ) 50-300 MG tablet   Other Relevant Orders   Comprehensive metabolic panel with GFR   T-helper cell (CD4)- ([facility] clinic only)   HIV RNA, RTPCR W/R GT (RTI, PI,INT)   Tobacco use   Mr. Waggle continues to smoke tobacco every other day. Counseled on the dangers of tobacco not ready to quit at this time.  Reviewed strategies to maximize success, including removing cigarettes and smoking materials from environment, stress management, substitution of other forms of reinforcement, support of family/friends, and written materials.       Healthcare maintenance   Not currently sexually active. Vaccinations are reviewed and declined following counseling Due for routine dental care which he will schedule independently        I have discontinued Jorrell M. Bernier's Biktarvy . I am also having him start on Dovato .   Meds ordered this encounter  Medications   dolutegravir -lamiVUDine  (DOVATO ) 50-300 MG tablet    Sig: Take 1 tablet by mouth daily.    Dispense:  30 tablet    Refill:  5    Please mail    Supervising Provider:   LUIZ CHANNEL [[id number]]     Follow-up: Return in about 1 month (around 12/30/2024). or sooner if needed.    Cathlyn July, MSN, FNP-C Nurse Practitioner [facility] for Infectious  Disease [facility] Main number: [phone]       [1] No Known Allergies  "

## 2024-11-30 NOTE — Progress Notes (Signed)
 Specialty Pharmacy Initial Fill Coordination Note  Shawn Cooke is a 29 y.o. male contacted today regarding initial fill of specialty medication(s) Dolutegravir -lamiVUDine  (Dovato )   Patient requested Delivery   Delivery date: 12/05/24   Verified address: [address] [place] [address]   Medication will be filled on: 11/30/24   Patient is aware of $0 copayment.

## 2024-11-30 NOTE — Assessment & Plan Note (Addendum)
 Shawn Cooke presents today to reengage in care and likely has poorly controlled virus secondary to being off medication for over 1 year.  Fortunately no signs/symptoms of opportunistic infection.  Discussed importance of taking medication and continued follow-up to reduce risk of disease progression and complications in the future.  Covered by [employer] with high deductible with Biktarvy  and will change to Dovato  with lower deductible costs and co-pay card associated with his account.  Sample of Dovato  provided and recorded in pharmacy log.  Check lab work.  Plan for follow-up in 1 month or sooner if needed with lab work on the same day.

## 2024-11-30 NOTE — Patient Instructions (Signed)
 Nice to see you.  We will check your lab work today.  Restart taking your medication daily as prescribed.  Refills have been sent to the pharmacy and they will be in touch.   Plan for follow up in 1 months or sooner if needed with lab work on the same day.  Have a great day and stay safe!

## 2024-11-30 NOTE — Assessment & Plan Note (Signed)
 Shawn Cooke continues to smoke tobacco every other day. Counseled on the dangers of tobacco not ready to quit at this time.  Reviewed strategies to maximize success, including removing cigarettes and smoking materials from environment, stress management, substitution of other forms of reinforcement, support of family/friends, and written materials.

## 2024-12-01 ENCOUNTER — Other Ambulatory Visit: Payer: Self-pay

## 2024-12-01 ENCOUNTER — Other Ambulatory Visit (HOSPITAL_COMMUNITY): Payer: Self-pay

## 2024-12-01 LAB — T-HELPER CELL (CD4) - (RCID CLINIC ONLY)
CD4 % Helper T Cell: 35 % (ref 33–65)
CD4 T Cell Abs: 671 /uL (ref 400–1790)

## 2024-12-04 ENCOUNTER — Other Ambulatory Visit (HOSPITAL_COMMUNITY): Payer: Self-pay

## 2024-12-05 LAB — HIV RNA, RTPCR W/R GT (RTI, PI,INT)
HIV 1 RNA Quant: 1080 {copies}/mL — ABNORMAL HIGH
HIV-1 RNA Quant, Log: 3.03 {Log_copies}/mL — ABNORMAL HIGH

## 2024-12-06 ENCOUNTER — Other Ambulatory Visit: Payer: Self-pay | Admitting: Pharmacist

## 2024-12-06 MED ORDER — DOVATO 50-300 MG PO TABS: 1.0000 | ORAL_TABLET | Freq: Every day | ORAL | Status: AC

## 2024-12-06 NOTE — Progress Notes (Signed)
 Medication Samples have been provided to the patient.  Drug name: Dovato         Strength: 50/300 mg         Qty: 14  Tablets (1 bottles) LOT: [id number]   Exp.Date: 7/27  Samples requested by Cathlyn Philemon PIETY.  Dosing instructions: Take one tablet by mouth once daily  The patient has been instructed regarding the correct time, dose, and frequency of taking this medication, including desired effects and most common side effects.   Alan Geralds, PharmD, CPP, BCIDP, AAHIVP Clinical Pharmacist Practitioner Infectious Diseases Clinical Pharmacist [facility] for Infectious Disease

## 2024-12-28 ENCOUNTER — Ambulatory Visit: Payer: Self-pay | Admitting: Family
